# Patient Record
Sex: Female | Born: 1945 | Race: Black or African American | Hispanic: No | Smoking: Never smoker
Health system: Southern US, Community
[De-identification: ages and names within clinical notes are randomized; demographics above are authoritative.]

## PROBLEM LIST (undated history)

## (undated) ENCOUNTER — Emergency Department (HOSPITAL_COMMUNITY): Admission: EM | Payer: BC Managed Care – PPO | Source: Home / Self Care

## (undated) DIAGNOSIS — I10 Essential (primary) hypertension: Secondary | ICD-10-CM

## (undated) DIAGNOSIS — N951 Menopausal and female climacteric states: Secondary | ICD-10-CM

## (undated) DIAGNOSIS — F32A Depression, unspecified: Secondary | ICD-10-CM

## (undated) DIAGNOSIS — F329 Major depressive disorder, single episode, unspecified: Secondary | ICD-10-CM

## (undated) HISTORY — DX: Essential (primary) hypertension: I10

## (undated) HISTORY — DX: Menopausal and female climacteric states: N95.1

## (undated) HISTORY — DX: Major depressive disorder, single episode, unspecified: F32.9

## (undated) HISTORY — DX: Depression, unspecified: F32.A

---

## 1968-06-08 HISTORY — PX: TUBAL LIGATION: SHX77

## 1998-06-08 HISTORY — PX: INGUINAL HERNIA REPAIR: SHX194

## 1998-10-03 ENCOUNTER — Other Ambulatory Visit: Admission: RE | Admit: 1998-10-03 | Discharge: 1998-10-03 | Payer: Self-pay | Admitting: Family Medicine

## 2004-05-05 ENCOUNTER — Ambulatory Visit: Payer: Self-pay | Admitting: Family Medicine

## 2004-06-05 ENCOUNTER — Ambulatory Visit: Payer: Self-pay | Admitting: Family Medicine

## 2004-08-08 ENCOUNTER — Other Ambulatory Visit: Admission: RE | Admit: 2004-08-08 | Discharge: 2004-08-08 | Payer: Self-pay | Admitting: Family Medicine

## 2004-08-08 ENCOUNTER — Ambulatory Visit: Payer: Self-pay | Admitting: Family Medicine

## 2004-10-23 ENCOUNTER — Ambulatory Visit: Payer: Self-pay

## 2005-08-04 ENCOUNTER — Ambulatory Visit: Payer: Self-pay | Admitting: Family Medicine

## 2005-11-06 ENCOUNTER — Ambulatory Visit: Payer: Self-pay | Admitting: Family Medicine

## 2005-11-20 ENCOUNTER — Ambulatory Visit: Payer: Self-pay | Admitting: Family Medicine

## 2005-12-04 ENCOUNTER — Ambulatory Visit: Payer: Self-pay | Admitting: Family Medicine

## 2006-03-05 ENCOUNTER — Ambulatory Visit: Payer: Self-pay | Admitting: Family Medicine

## 2006-04-02 ENCOUNTER — Ambulatory Visit: Payer: Self-pay | Admitting: Family Medicine

## 2006-07-12 DIAGNOSIS — F320 Major depressive disorder, single episode, mild: Secondary | ICD-10-CM | POA: Insufficient documentation

## 2006-07-12 DIAGNOSIS — I10 Essential (primary) hypertension: Secondary | ICD-10-CM | POA: Insufficient documentation

## 2006-11-05 ENCOUNTER — Ambulatory Visit: Payer: Self-pay | Admitting: Family Medicine

## 2006-11-12 ENCOUNTER — Ambulatory Visit: Payer: Self-pay | Admitting: Family Medicine

## 2006-11-17 LAB — CONVERTED CEMR LAB
Alkaline Phosphatase: 73 units/L (ref 39–117)
BUN: 6 mg/dL (ref 6–23)
Basophils Relative: 0.4 % (ref 0.0–1.0)
Bilirubin, Direct: 0.1 mg/dL (ref 0.0–0.3)
CO2: 32 meq/L (ref 19–32)
Cholesterol: 193 mg/dL (ref 0–200)
Eosinophils Relative: 2.4 % (ref 0.0–5.0)
GFR calc Af Amer: 110 mL/min
LDL Cholesterol: 137 mg/dL — ABNORMAL HIGH (ref 0–99)
MCHC: 33.2 g/dL (ref 30.0–36.0)
MCV: 82.3 fL (ref 78.0–100.0)
Monocytes Absolute: 0.6 10*3/uL (ref 0.2–0.7)
Neutro Abs: 4.3 10*3/uL (ref 1.4–7.7)
Neutrophils Relative %: 62 % (ref 43.0–77.0)
Potassium: 4.1 meq/L (ref 3.5–5.1)
RDW: 13.4 % (ref 11.5–14.6)
Total CHOL/HDL Ratio: 4.8
Total Protein: 7.5 g/dL (ref 6.0–8.3)
Triglycerides: 76 mg/dL (ref 0–149)
WBC: 7 10*3/uL (ref 4.5–10.5)

## 2006-12-02 ENCOUNTER — Telehealth (INDEPENDENT_AMBULATORY_CARE_PROVIDER_SITE_OTHER): Payer: Self-pay | Admitting: *Deleted

## 2006-12-24 ENCOUNTER — Ambulatory Visit: Payer: Self-pay | Admitting: Family Medicine

## 2006-12-24 DIAGNOSIS — K430 Incisional hernia with obstruction, without gangrene: Secondary | ICD-10-CM | POA: Insufficient documentation

## 2007-02-11 ENCOUNTER — Encounter: Payer: Self-pay | Admitting: Family Medicine

## 2007-02-11 ENCOUNTER — Other Ambulatory Visit: Admission: RE | Admit: 2007-02-11 | Discharge: 2007-02-11 | Payer: Self-pay | Admitting: Family Medicine

## 2007-02-11 ENCOUNTER — Ambulatory Visit: Payer: Self-pay | Admitting: Family Medicine

## 2007-02-11 DIAGNOSIS — Z87440 Personal history of urinary (tract) infections: Secondary | ICD-10-CM | POA: Insufficient documentation

## 2007-02-11 DIAGNOSIS — N951 Menopausal and female climacteric states: Secondary | ICD-10-CM | POA: Insufficient documentation

## 2007-02-11 LAB — CONVERTED CEMR LAB
Bilirubin Urine: NEGATIVE
Glucose, Urine, Semiquant: NEGATIVE
Pap Smear: NORMAL
Specific Gravity, Urine: 1.025
pH: 6

## 2007-02-14 ENCOUNTER — Encounter: Payer: Self-pay | Admitting: Family Medicine

## 2007-02-15 ENCOUNTER — Encounter (INDEPENDENT_AMBULATORY_CARE_PROVIDER_SITE_OTHER): Payer: Self-pay | Admitting: *Deleted

## 2007-02-17 ENCOUNTER — Encounter (INDEPENDENT_AMBULATORY_CARE_PROVIDER_SITE_OTHER): Payer: Self-pay | Admitting: *Deleted

## 2007-03-21 ENCOUNTER — Ambulatory Visit: Payer: Self-pay | Admitting: Family Medicine

## 2007-03-25 ENCOUNTER — Encounter (INDEPENDENT_AMBULATORY_CARE_PROVIDER_SITE_OTHER): Payer: Self-pay | Admitting: *Deleted

## 2007-03-25 LAB — CONVERTED CEMR LAB
ALT: 15 units/L (ref 0–35)
AST: 14 units/L (ref 0–37)
Alkaline Phosphatase: 74 units/L (ref 39–117)
Basophils Relative: 0.8 % (ref 0.0–1.0)
CO2: 33 meq/L — ABNORMAL HIGH (ref 19–32)
Creatinine, Ser: 0.7 mg/dL (ref 0.4–1.2)
Eosinophils Relative: 2.6 % (ref 0.0–5.0)
GFR calc Af Amer: 110 mL/min
Glucose, Bld: 105 mg/dL — ABNORMAL HIGH (ref 70–99)
HCT: 37.9 % (ref 36.0–46.0)
HDL: 34.9 mg/dL — ABNORMAL LOW (ref >39.0)
Hemoglobin: 12.6 g/dL (ref 12.0–15.0)
LDL Cholesterol: 112 mg/dL — ABNORMAL HIGH (ref 0–99)
Lymphocytes Relative: 26.9 % (ref 12.0–46.0)
Monocytes Absolute: 0.6 10*3/uL (ref 0.2–0.7)
Neutro Abs: 4.1 10*3/uL (ref 1.4–7.7)
Neutrophils Relative %: 60.8 % (ref 43.0–77.0)
Potassium: 3.7 meq/L (ref 3.5–5.1)
RDW: 13.5 % (ref 11.5–14.6)
Sodium: 146 meq/L — ABNORMAL HIGH (ref 135–145)
Total Bilirubin: 0.6 mg/dL (ref 0.3–1.2)
Total Protein: 6.6 g/dL (ref 6.0–8.3)
VLDL: 21 mg/dL (ref 0–40)

## 2007-04-14 ENCOUNTER — Telehealth (INDEPENDENT_AMBULATORY_CARE_PROVIDER_SITE_OTHER): Payer: Self-pay | Admitting: *Deleted

## 2007-04-25 ENCOUNTER — Ambulatory Visit: Payer: Self-pay | Admitting: Family Medicine

## 2007-04-25 DIAGNOSIS — M79609 Pain in unspecified limb: Secondary | ICD-10-CM | POA: Insufficient documentation

## 2007-07-12 ENCOUNTER — Encounter: Payer: Self-pay | Admitting: Family Medicine

## 2007-08-04 ENCOUNTER — Ambulatory Visit: Payer: Self-pay | Admitting: Family Medicine

## 2007-08-04 DIAGNOSIS — M25569 Pain in unspecified knee: Secondary | ICD-10-CM | POA: Insufficient documentation

## 2007-10-19 ENCOUNTER — Telehealth (INDEPENDENT_AMBULATORY_CARE_PROVIDER_SITE_OTHER): Payer: Self-pay | Admitting: *Deleted

## 2008-03-23 ENCOUNTER — Ambulatory Visit: Payer: Self-pay | Admitting: Family Medicine

## 2008-03-23 LAB — CONVERTED CEMR LAB
Bilirubin Urine: NEGATIVE
Glucose, Urine, Semiquant: NEGATIVE
Protein, U semiquant: NEGATIVE
Specific Gravity, Urine: 1.025
WBC Urine, dipstick: NEGATIVE
pH: 5

## 2008-03-30 ENCOUNTER — Telehealth (INDEPENDENT_AMBULATORY_CARE_PROVIDER_SITE_OTHER): Payer: Self-pay | Admitting: *Deleted

## 2008-03-30 ENCOUNTER — Ambulatory Visit: Payer: Self-pay | Admitting: Family Medicine

## 2008-03-30 ENCOUNTER — Encounter (INDEPENDENT_AMBULATORY_CARE_PROVIDER_SITE_OTHER): Payer: Self-pay | Admitting: *Deleted

## 2008-03-30 LAB — CONVERTED CEMR LAB
Bilirubin Urine: NEGATIVE
Ketones, urine, test strip: NEGATIVE
Nitrite: NEGATIVE
pH: 6

## 2008-03-31 ENCOUNTER — Encounter: Payer: Self-pay | Admitting: Family Medicine

## 2008-04-02 ENCOUNTER — Encounter (INDEPENDENT_AMBULATORY_CARE_PROVIDER_SITE_OTHER): Payer: Self-pay | Admitting: *Deleted

## 2008-04-02 LAB — CONVERTED CEMR LAB: GC Probe Amp, Urine: NEGATIVE

## 2008-04-04 ENCOUNTER — Encounter (INDEPENDENT_AMBULATORY_CARE_PROVIDER_SITE_OTHER): Payer: Self-pay | Admitting: *Deleted

## 2008-06-26 ENCOUNTER — Encounter: Payer: Self-pay | Admitting: Family Medicine

## 2008-10-19 ENCOUNTER — Telehealth (INDEPENDENT_AMBULATORY_CARE_PROVIDER_SITE_OTHER): Payer: Self-pay | Admitting: *Deleted

## 2008-11-09 ENCOUNTER — Ambulatory Visit: Payer: Self-pay | Admitting: Family Medicine

## 2008-11-09 DIAGNOSIS — R609 Edema, unspecified: Secondary | ICD-10-CM | POA: Insufficient documentation

## 2008-11-18 LAB — CONVERTED CEMR LAB
AST: 16 units/L (ref 0–37)
Albumin: 3.8 g/dL (ref 3.5–5.2)
BUN: 18 mg/dL (ref 6–23)
Basophils Absolute: 0.1 10*3/uL (ref 0.0–0.1)
CO2: 33 meq/L — ABNORMAL HIGH (ref 19–32)
Cholesterol: 192 mg/dL (ref 0–200)
Eosinophils Absolute: 0.1 10*3/uL (ref 0.0–0.7)
GFR calc non Af Amer: 93.72 mL/min (ref >60)
Glucose, Bld: 115 mg/dL — ABNORMAL HIGH (ref 70–99)
HCT: 40.2 % (ref 36.0–46.0)
Hemoglobin: 13.5 g/dL (ref 12.0–15.0)
Lymphs Abs: 1.6 10*3/uL (ref 0.7–4.0)
MCHC: 33.5 g/dL (ref 30.0–36.0)
MCV: 83.2 fL (ref 78.0–100.0)
Monocytes Absolute: 0.4 10*3/uL (ref 0.1–1.0)
Neutro Abs: 3.6 10*3/uL (ref 1.4–7.7)
Platelets: 200 10*3/uL (ref 150.0–400.0)
Potassium: 4.2 meq/L (ref 3.5–5.1)
RDW: 13.7 % (ref 11.5–14.6)
Sodium: 144 meq/L (ref 135–145)
TSH: 1.31 microintl units/mL (ref 0.35–5.50)
Total Bilirubin: 0.6 mg/dL (ref 0.3–1.2)
VLDL: 20.8 mg/dL (ref 0.0–40.0)

## 2008-11-19 ENCOUNTER — Encounter (INDEPENDENT_AMBULATORY_CARE_PROVIDER_SITE_OTHER): Payer: Self-pay | Admitting: *Deleted

## 2008-11-23 ENCOUNTER — Ambulatory Visit: Payer: Self-pay | Admitting: Family Medicine

## 2008-12-21 ENCOUNTER — Ambulatory Visit: Payer: Self-pay | Admitting: Family Medicine

## 2008-12-25 ENCOUNTER — Telehealth (INDEPENDENT_AMBULATORY_CARE_PROVIDER_SITE_OTHER): Payer: Self-pay | Admitting: *Deleted

## 2008-12-26 ENCOUNTER — Telehealth (INDEPENDENT_AMBULATORY_CARE_PROVIDER_SITE_OTHER): Payer: Self-pay | Admitting: *Deleted

## 2009-02-07 ENCOUNTER — Ambulatory Visit: Payer: Self-pay | Admitting: Family Medicine

## 2009-02-07 ENCOUNTER — Observation Stay (HOSPITAL_COMMUNITY): Admission: EM | Admit: 2009-02-07 | Discharge: 2009-02-08 | Payer: Self-pay | Admitting: Emergency Medicine

## 2009-02-07 DIAGNOSIS — R079 Chest pain, unspecified: Secondary | ICD-10-CM | POA: Insufficient documentation

## 2009-02-08 ENCOUNTER — Encounter (INDEPENDENT_AMBULATORY_CARE_PROVIDER_SITE_OTHER): Payer: Self-pay | Admitting: Internal Medicine

## 2009-02-08 ENCOUNTER — Ambulatory Visit: Payer: Self-pay | Admitting: Vascular Surgery

## 2009-02-08 DIAGNOSIS — R9431 Abnormal electrocardiogram [ECG] [EKG]: Secondary | ICD-10-CM | POA: Insufficient documentation

## 2009-02-12 ENCOUNTER — Telehealth (INDEPENDENT_AMBULATORY_CARE_PROVIDER_SITE_OTHER): Payer: Self-pay | Admitting: *Deleted

## 2009-02-13 ENCOUNTER — Ambulatory Visit: Payer: Self-pay

## 2009-02-13 ENCOUNTER — Encounter: Payer: Self-pay | Admitting: Cardiology

## 2009-02-14 ENCOUNTER — Ambulatory Visit: Payer: Self-pay

## 2009-02-15 ENCOUNTER — Ambulatory Visit: Payer: Self-pay | Admitting: Family Medicine

## 2009-02-15 DIAGNOSIS — R0989 Other specified symptoms and signs involving the circulatory and respiratory systems: Secondary | ICD-10-CM

## 2009-02-15 DIAGNOSIS — R0609 Other forms of dyspnea: Secondary | ICD-10-CM | POA: Insufficient documentation

## 2009-02-15 DIAGNOSIS — G4700 Insomnia, unspecified: Secondary | ICD-10-CM | POA: Insufficient documentation

## 2009-02-19 ENCOUNTER — Telehealth (INDEPENDENT_AMBULATORY_CARE_PROVIDER_SITE_OTHER): Payer: Self-pay | Admitting: *Deleted

## 2009-02-19 LAB — CONVERTED CEMR LAB
Anti Nuclear Antibody(ANA): NEGATIVE
BUN: 15 mg/dL (ref 6–23)
Chloride: 103 meq/L (ref 96–112)
Magnesium: 1.8 mg/dL (ref 1.5–2.5)
Potassium: 4.2 meq/L (ref 3.5–5.3)
Rhuematoid fact SerPl-aCnc: 21 intl units/mL — ABNORMAL HIGH (ref 0–20)
Sed Rate: 8 mm/hr (ref 0–22)

## 2009-02-25 ENCOUNTER — Ambulatory Visit: Payer: Self-pay | Admitting: Pulmonary Disease

## 2009-02-25 ENCOUNTER — Telehealth: Payer: Self-pay | Admitting: Family Medicine

## 2009-02-26 ENCOUNTER — Ambulatory Visit: Payer: Self-pay | Admitting: Family Medicine

## 2009-03-05 ENCOUNTER — Ambulatory Visit: Payer: Self-pay | Admitting: Family Medicine

## 2009-03-13 ENCOUNTER — Telehealth (INDEPENDENT_AMBULATORY_CARE_PROVIDER_SITE_OTHER): Payer: Self-pay | Admitting: *Deleted

## 2009-03-24 ENCOUNTER — Ambulatory Visit (HOSPITAL_BASED_OUTPATIENT_CLINIC_OR_DEPARTMENT_OTHER): Admission: RE | Admit: 2009-03-24 | Discharge: 2009-03-24 | Payer: Self-pay | Admitting: Pulmonary Disease

## 2009-03-24 ENCOUNTER — Encounter: Payer: Self-pay | Admitting: Pulmonary Disease

## 2009-04-02 ENCOUNTER — Ambulatory Visit: Payer: Self-pay | Admitting: Pulmonary Disease

## 2009-04-03 ENCOUNTER — Telehealth (INDEPENDENT_AMBULATORY_CARE_PROVIDER_SITE_OTHER): Payer: Self-pay | Admitting: *Deleted

## 2009-04-15 ENCOUNTER — Ambulatory Visit: Payer: Self-pay | Admitting: Pulmonary Disease

## 2009-04-15 DIAGNOSIS — G4733 Obstructive sleep apnea (adult) (pediatric): Secondary | ICD-10-CM | POA: Insufficient documentation

## 2009-07-04 ENCOUNTER — Ambulatory Visit: Payer: Self-pay | Admitting: Family Medicine

## 2009-07-04 ENCOUNTER — Telehealth (INDEPENDENT_AMBULATORY_CARE_PROVIDER_SITE_OTHER): Payer: Self-pay | Admitting: *Deleted

## 2010-01-31 ENCOUNTER — Ambulatory Visit: Payer: Self-pay | Admitting: Family Medicine

## 2010-02-04 ENCOUNTER — Encounter: Payer: Self-pay | Admitting: Gastroenterology

## 2010-02-04 ENCOUNTER — Encounter (INDEPENDENT_AMBULATORY_CARE_PROVIDER_SITE_OTHER): Payer: Self-pay | Admitting: *Deleted

## 2010-02-13 ENCOUNTER — Ambulatory Visit (HOSPITAL_COMMUNITY): Admission: RE | Admit: 2010-02-13 | Discharge: 2010-02-13 | Payer: Self-pay | Admitting: Family Medicine

## 2010-02-13 ENCOUNTER — Ambulatory Visit: Payer: Self-pay

## 2010-02-13 ENCOUNTER — Ambulatory Visit: Payer: Self-pay | Admitting: Cardiology

## 2010-02-13 ENCOUNTER — Encounter: Payer: Self-pay | Admitting: Family Medicine

## 2010-02-14 ENCOUNTER — Ambulatory Visit: Payer: Self-pay | Admitting: Family Medicine

## 2010-02-15 DIAGNOSIS — I519 Heart disease, unspecified: Secondary | ICD-10-CM | POA: Insufficient documentation

## 2010-02-27 ENCOUNTER — Telehealth: Payer: Self-pay | Admitting: Family Medicine

## 2010-03-10 ENCOUNTER — Encounter (INDEPENDENT_AMBULATORY_CARE_PROVIDER_SITE_OTHER): Payer: Self-pay | Admitting: *Deleted

## 2010-03-12 ENCOUNTER — Ambulatory Visit: Payer: Self-pay | Admitting: Gastroenterology

## 2010-05-16 ENCOUNTER — Ambulatory Visit: Payer: Self-pay | Admitting: Family Medicine

## 2010-06-29 ENCOUNTER — Encounter: Payer: Self-pay | Admitting: Family Medicine

## 2010-07-08 NOTE — Letter (Signed)
Summary: [facility] Instructions  [facility]  [address] [place], [place] [address]   Phone: [phone]  Fax: [phone]       CALLI BASHOR    September 12, 1959    MRN: [id number]        Procedure Day Dorna Bloom:  Wednesday 03/26/2010     Arrival Time: 7:30 am      Procedure Time: 8:30 am     Location of Procedure:                    _ x_  [facility] (4th Floor)                        PREPARATION FOR COLONOSCOPY WITH MOVIPREP   Starting 5 days prior to your procedure Friday 10/14 do not eat nuts, seeds, popcorn, corn, beans, peas,  salads, or any raw vegetables.  Do not take any fiber supplements (e.g. Metamucil, Citrucel, and Benefiber).  THE DAY BEFORE YOUR PROCEDURE         DATE: Tuesday 10/18  1.  Drink clear liquids the entire day-NO SOLID FOOD  2.  Do not drink anything colored red or purple.  Avoid juices with pulp.  No orange juice.  3.  Drink at least 64 oz. (8 glasses) of fluid/clear liquids during the day to prevent dehydration and help the prep work efficiently.  CLEAR LIQUIDS INCLUDE: Water Jello Ice Popsicles Tea (sugar ok, no milk/cream) Powdered fruit flavored drinks Coffee (sugar ok, no milk/cream) Gatorade Juice: apple, white grape, white cranberry  Lemonade Clear bullion, consomm, broth Carbonated beverages (any kind) Strained chicken noodle soup Hard Candy                             4.  In the morning, mix first dose of MoviPrep solution:    Empty 1 Pouch A and 1 Pouch B into the disposable container    Add lukewarm drinking water to the top line of the container. Mix to dissolve    Refrigerate (mixed solution should be used within 24 hrs)  5.  Begin drinking the prep at 5:00 p.m. The MoviPrep container is divided by 4 marks.   Every 15 minutes drink the solution down to the next mark (approximately 8 oz) until the full liter is complete.   6.  Follow completed prep with 16 oz of clear liquid of your choice  (Nothing red or purple).  Continue to drink clear liquids until bedtime.  7.  Before going to bed, mix second dose of MoviPrep solution:    Empty 1 Pouch A and 1 Pouch B into the disposable container    Add lukewarm drinking water to the top line of the container. Mix to dissolve    Refrigerate  THE DAY OF YOUR PROCEDURE      DATE: Wednesday 10/19  Beginning at 3:30 a.m. (5 hours before procedure):         1. Every 15 minutes, drink the solution down to the next mark (approx 8 oz) until the full liter is complete.  2. Follow completed prep with 16 oz. of clear liquid of your choice.    3. You may drink clear liquids until 6:30 am (2 HOURS BEFORE PROCEDURE).   MEDICATION INSTRUCTIONS  Unless otherwise instructed, you should take regular prescription medications with a small sip of water   as early as possible the morning of  your procedure.   Additional medication instructions:  Hold Furosemide morning of procedure only!         OTHER INSTRUCTIONS  You will need a responsible adult at least 63 years of age to accompany you and drive you home.   This person must remain in the waiting room during your procedure.  Wear loose fitting clothing that is easily removed.  Leave jewelry and other valuables at home.  However, you may wish to bring a book to read or  an iPod/MP3 player to listen to music as you wait for your procedure to start.  Remove all body piercing jewelry and leave at home.  Total time from sign-in until discharge is approximately 2-3 hours.  You should go home directly after your procedure and rest.  You can resume normal activities the  day after your procedure.  The day of your procedure you should not:   Drive   Make legal decisions   Operate machinery   Drink alcohol   Return to work  You will receive specific instructions about eating, activities and medications before you leave.    The above instructions have been reviewed and  explained to me by   Sherren Kerns RN  March 12, 2010 9:45 AM    I fully understand and can verbalize these instructions _____________________________ Date _________

## 2010-07-08 NOTE — Progress Notes (Signed)
Summary: X- ray results  Phone Note Outgoing Call   Call placed by: Army Fossa CMA,  July 04, 2009 2:59 PM Summary of Call: Regarding X-rays,LMTCB:  Tibia/Fibula: no abnormality where pain was Signed by Loreen Freud DO on 07/04/2009 at 2:51 PM  Left Knee: + arthritis Signed by Loreen Freud DO on 07/04/2009 at 2:51 PM      Follow-up for Phone Call        LMTCB. Army Fossa CMA  July 10, 2009 1:20 PM   Additional Follow-up for Phone Call Additional follow up Details #1::        LMTCB.Army Fossa CMA  July 17, 2009 1:49 PM     Additional Follow-up for Phone Call Additional follow up Details #2::    LMTCB. Army Fossa CMA  July 18, 2009 10:14 AM   Additional Follow-up for Phone Call Additional follow up Details #3:: Details for Additional Follow-up Action Taken: Pt is aware. Army Fossa CMA  July 18, 2009 4:02 PM

## 2010-07-08 NOTE — Assessment & Plan Note (Signed)
Summary: PAIN IN LEGS/KDC   Vital Signs:  Patient profile:   65 year old female Weight:      269 pounds Temp:     98.1 degrees F oral Pulse rate:   86 / minute Pulse rhythm:   regular BP sitting:   130 / 84  (left arm) Cuff size:   large  Vitals Entered By: Army Fossa CMA (July 04, 2009 9:50 AM) CC: Pain in both legs discuss meds- she is only taking bupropion,metorplol,ultram.    History of Present Illness: Pt here still c/o pain in left leg.  No new injury.  Pt is in Knee and shin-- after she gets up leg feels stiff and it is better when she has been up for   Current Medications (verified): 1)  Bupropion Hcl (Smoking Deter) 150 Mg Tb12 (Bupropion Hcl (Smoking Deter)) .... 3 Once Daily 2)  Metoprolol Tartrate 100 Mg Tabs (Metoprolol Tartrate) .... Take 1 Tablet By Mouth Two Times A Day 3)  Aspirin 325 Mg Tabs (Aspirin) .Marland Kitchen.. 1 By Mouth Once Daily 4)  Furosemide 20 Mg Tabs (Furosemide) .... 3 Tablets By Mouth Daily 5)  Lisinopril 20 Mg Tabs (Lisinopril) .Marland Kitchen.. 1 By Mouth Once Daily 6)  Ultram 50 Mg Tabs (Tramadol Hcl) .Marland Kitchen.. 1-2 By Mouth Every 6 Hours As Needed 7)  Vitamin D (Ergocalciferol) 50000 Unit Caps (Ergocalciferol) .Marland Kitchen.. 1 By Mouth Weekly. 8)  Vitamin D3 2000 Unit Caps (Cholecalciferol) .Marland Kitchen.. 1 By Mouth Once Daily.  Allergies: 1)  * Norvasc  Past History:  Past Medical History: Last updated: 02/25/2009  SNORING (ICD-786.09) INSOMNIA (ICD-780.52) ABNORMAL ELECTROCARDIOGRAM (ICD-794.31) ARM PAIN, LEFT (ICD-729.5) CHEST PAIN UNSPECIFIED (ICD-786.50) EDEMA (ICD-782.3) KNEE PAIN (ICD-719.46) LEG PAIN, LEFT (ICD-729.5) FOOT PAIN, RIGHT (ICD-729.5) HX, URINARY INFECTION (ICD-V13.02) MENOPAUSAL SYNDROME (ICD-627.2) PREVENTIVE HEALTH CARE (ICD-V70.0) HYPERTENSION (ICD-401.9) DEPRESSION (ICD-311)    Past Surgical History: Last updated: 02/25/2009 Tubal ligation 1970 Inguinal herniorrhaphy (2000)  Family History: Last updated: 02/25/2009 Family History  Diabetes 1st degree relative Family History Hypertension Family History Liver disease M--Panc. CA Family History of Alcoholism/Addiction Family History Depression  asthma: son cancer: mother (pancreatic) maternal grandmother (breast)   Social History: Last updated: 02/11/2007 Occupation: [employer] at [employer] Married, 3 children Never Smoked Alcohol use-yes Drug use-no Regular exercise-yes  Risk Factors: Alcohol Use: <1 (02/07/2009) Caffeine Use: 0 (02/07/2009) Exercise: yes (02/07/2009)  Risk Factors: Smoking Status: never (02/07/2009) Passive Smoke Exposure: no (02/07/2009)  Family History: Reviewed history from 02/25/2009 and no changes required. Family History Diabetes 1st degree relative Family History Hypertension Family History Liver disease M--Panc. CA Family History of Alcoholism/Addiction Family History Depression  asthma: son cancer: mother (pancreatic) maternal grandmother (breast)   Social History: Reviewed history from 02/11/2007 and no changes required. Occupation: [employer] at [employer], 3 children Never Smoked Alcohol use-yes Drug use-no Regular exercise-yes  Review of Systems      See HPI  Physical Exam  General:  Well-developed,well-nourished,in no acute distress; alert,appropriate and cooperative throughout examination Msk:  normal ROM, no joint tenderness, no joint swelling, no joint warmth, no redness over joints, no joint deformities, no joint instability, and no crepitation.   Extremities:  No clubbing, cyanosis, edema, or deformity noted with normal full range of motion of all joints.   Neurologic:  alert & oriented X3, strength normal in all extremities, gait normal, and DTRs symmetrical and normal.   Psych:  Cognition and judgment appear intact. Alert and cooperative with normal attention span and concentration. No apparent delusions, illusions, hallucinations  Impression & Recommendations:  Problem # 1:  LEG PAIN, LEFT  (ICD-729.5) ultram as needed  Orders: Physical Therapy Referral (PT) T-Knee Left 2 view (73560TC)  Complete Medication List: 1)  Bupropion Hcl (smoking Deter) 150 Mg Tb12 (Bupropion hcl (smoking deter)) .... 3 once daily 2)  Metoprolol Tartrate 100 Mg Tabs (Metoprolol tartrate) .... Take 1 tablet by mouth two times a day 3)  Aspirin 325 Mg Tabs (Aspirin) .Marland Kitchen.. 1 by mouth once daily 4)  Furosemide 20 Mg Tabs (Furosemide) .... 3 tablets by mouth daily 5)  Lisinopril 20 Mg Tabs (Lisinopril) .Marland Kitchen.. 1 by mouth once daily 6)  Ultram 50 Mg Tabs (Tramadol hcl) .Marland Kitchen.. 1-2 by mouth every 6 hours as needed 7)  Vitamin D (ergocalciferol) 50000 Unit Caps (Ergocalciferol) .Marland Kitchen.. 1 by mouth weekly. 8)  Vitamin D3 2000 Unit Caps (Cholecalciferol) .Marland Kitchen.. 1 by mouth once daily.  Other Orders: T-Tib/Fib Left (73590TC) Prescriptions: FUROSEMIDE 20 MG TABS (FUROSEMIDE) 3 tablets by mouth daily  #30 x 5   Entered and Authorized by:   Loreen Freud DO   Signed by:   Loreen Freud DO on 07/04/2009   Method used:   [facility]        [facility] [phone]* (retail)       [address] [place]  [address]       Ph: [phone] or [phone]       Fax: [phone]   RxID:   [id number] LISINOPRIL 20 MG TABS (LISINOPRIL) 1 by mouth once daily  #30 x 2   Entered and Authorized by:   Loreen Freud DO   Signed by:   Loreen Freud DO on 07/04/2009   Method used:   [facility]        [facility] [phone]* (retail)       [address]       [place], [place]  [address]       Ph: [phone] or [phone]       Fax: [phone]   RxID:   [id number] VITAMIN D (ERGOCALCIFEROL) 50000 UNIT CAPS (ERGOCALCIFEROL) 1 by mouth weekly.  #12 x 2   Entered and Authorized by:   Loreen Freud DO   Signed by:   Loreen Freud DO on 07/04/2009   Method used:   [facility]        [facility] [phone]* (retail)       [address] [place]  [address]       Ph: [phone] or  [phone]       Fax: [phone]   RxID:   [id number] ULTRAM 50 MG TABS (TRAMADOL HCL) 1-2 by mouth every 6 hours as needed  #60 x 1   Entered and Authorized by:   Loreen Freud DO   Signed by:   Loreen Freud DO on 07/04/2009   Method used:   [facility]        [facility] [phone]* (retail)       [address], [place]  [address]       Ph: [phone] or [phone]       Fax: [phone]   RxID:   [id number] METOPROLOL TARTRATE 100 MG TABS (METOPROLOL TARTRATE) Take 1 tablet by mouth two times a day  #180 Tablet x 4   Entered and Authorized by:  Loreen Freud DO   Signed by:   Loreen Freud DO on 07/04/2009   Method used:   [facility]        [facility] [phone]* (retail)       [address] [place]  [address]       Ph: [phone] or [phone]       Fax: [phone]   RxID:   [id number] BUPROPION HCL (SMOKING DETER) 150 MG TB12 (BUPROPION HCL (SMOKING DETER)) 3 once daily  #180 Tablet x 5   Entered and Authorized by:   Loreen Freud DO   Signed by:   Loreen Freud DO on 07/04/2009   Method used:   [facility]        [facility] [phone]* (retail)       [address]       [place], [place]  [address]       Ph: [phone] or [phone]       Fax: [phone]   RxID:   [id number]

## 2010-07-08 NOTE — Progress Notes (Signed)
Summary: less expensive med  Phone Note Refill Request Message from:  Fax from Pharmacy on February 27, 2010 4:52 PM  Refills Requested: Medication #1:  BENICAR 20 MG TABS 1 by mouth once daily [facility] [address] [facility] [id number] - note "could you please change to a cost saving generic"  Initial call taken by: Okey Regal [place],  February 27, 2010 4:54 PM  Follow-up for Phone Call        cozaar 50 mg 1 by mouth once daily  #30  2 refills recheck bp 2-3 weeks Follow-up by: Loreen Freud DO,  February 28, 2010 9:46 AM  Additional Follow-up for Phone Call Additional follow up Details #1::        pt aware, will call back to schedule f/u appt. [facility] [place]. Additional Follow-up by: Almeta Monas CMA [place]),  February 28, 2010 3:23 PM    New/Updated Medications: COZAAR 50 MG TABS (LOSARTAN POTASSIUM) 1 by mouth once daily Prescriptions: COZAAR 50 MG TABS (LOSARTAN POTASSIUM) 1 by mouth once daily  #30 x 2   Entered by:   Almeta Monas CMA (AAMA)   Authorized by:   Loreen Freud DO   Signed by:   Almeta Monas CMA (AAMA) on 02/28/2010   Method used:   Printed then faxed to ...       [facility] [place] [phone]* (retail)       [phone]       [place], [place]  [address]       Ph: [phone] or [phone]       Fax: [phone]   RxID:   [id number]

## 2010-07-08 NOTE — Letter (Signed)
Summary: Primary Care Consult Scheduled Letter  [facility] [place]  [address] [place], [place] [address]   Phone: [phone]  Fax: [phone]      02/04/2010 MRN: [id number]  [facility] [address] [place], [place]  [address]    Dear Ms. Begue,    We have scheduled an appointment for you.  At the recommendation of Dr. Loreen Freud, we have scheduled you for a Screening Colonoscopy with [facility].  Your initial Pre-visit with a Nurse is on 03-12-2010 at 9:30am.  Your Colonoscopy is on 03-26-2010 arrive by 7:30am with Dr. Melvia Heaps.  Their address is [address], [place] [address]. The office phone number is [phone].  If this appointment day and time is not convenient for you, please feel free to call the office of the doctor you are being referred to at the number listed above and reschedule the appointment.    It is important for you to keep your scheduled appointments. We are here to make sure you are given good patient care.   Thank you,    Renee, Patient Care Coordinator [place] at [facility]

## 2010-07-08 NOTE — Miscellaneous (Signed)
Summary: previsit prep/rm  Clinical Lists Changes  Medications: Added new medication of MOVIPREP 100 GM  SOLR (PEG-KCL-NACL-NASULF-NA ASC-C) As per prep instructions. - Signed Rx of MOVIPREP 100 GM  SOLR (PEG-KCL-NACL-NASULF-NA ASC-C) As per prep instructions.;  #1 x 0;  Signed;  Entered by: Sherren Kerns RN;  Authorized by: Louis Meckel MD;  Method used: [facility]. [phone]*, [address] [place], [place]  [address], Ph: [phone] or [phone], Fax: [phone] Observations: Added new observation of ALLERGY REV: Done (03/12/2010 9:07)    Prescriptions: MOVIPREP 100 GM  SOLR (PEG-KCL-NACL-NASULF-NA ASC-C) As per prep instructions.  #1 x 0   Entered by:   Sherren Kerns RN   Authorized by:   Louis Meckel MD   Signed by:   Sherren Kerns RN on 03/12/2010   Method used:   [facility]        [facility]. [phone]* (retail)       [address]       [place], [place]  [address]       Ph: [phone] or [phone]       Fax: [phone]   RxID:   [id number]

## 2010-07-08 NOTE — Letter (Signed)
Summary: Primary Care Consult Scheduled Letter  [facility] [place]  [address] [place], [place] [address]   Phone: [phone]  Fax: [phone]      02/04/2010 MRN: [id number]  [facility] [address] [place], [place]  [address]    Dear Michaela Martinez,    We have scheduled an appointment for you.  At the recommendation of Dr. Loreen Freud, we have scheduled you for an Echocardiogram with [facility] on 02-13-2010 arrive by 5:00pm.  Their address is [address] [place], [place] [address]. The office phone number is [phone].  If this appointment day and time is not convenient for you, please feel free to call the office of the doctor you are being referred to at the number listed above and reschedule the appointment.    It is important for you to keep your scheduled appointments. We are here to make sure you are given good patient care.  Thank you,    Michaela Martinez, Patient Care Coordinator [place] at [facility]

## 2010-07-08 NOTE — Letter (Signed)
Summary: Pre Visit Letter Revised  [facility]  [address] [place], [place] [address]   Phone: [phone]  Fax: [phone]        02/04/2010 MRN: [id number] [facility] [address] [place], [place]  [address]             Procedure Date:  03-26-10 Wed  Welcome to the Gastroenterology Division at [facility].    You are scheduled to see a nurse for your pre-procedure visit on 03-12-10 at 9:30am on the 3rd floor at [facility], [address]  We ask that you try to arrive at our office 15 minutes prior to your appointment time to allow for check-in.  Please take a minute to review the attached form.  If you answer "Yes" to one or more of the questions on the first page, we ask that you call the person listed at your earliest opportunity.  If you answer "No" to all of the questions, please complete the rest of the form and bring it to your appointment.    Your nurse visit will consist of discussing your medical and surgical history, your immediate family medical history, and your medications.   If you are unable to list all of your medications on the form, please bring the medication bottles to your appointment and we will list them.  We will need to be aware of both prescribed and over the counter drugs.  We will need to know exact dosage information as well.    Please be prepared to read and sign documents such as consent forms, a financial agreement, and acknowledgement forms.  If necessary, and with your consent, a friend or relative is welcome to sit-in on the nurse visit with you.  Please bring your insurance card so that we may make a copy of it.  If your insurance requires a referral to see a specialist, please bring your referral form from your primary care physician.  No co-pay is required for this nurse visit.     If you cannot keep your appointment, please call [phone] to cancel or reschedule prior to your appointment date.  This  allows [place] the opportunity to schedule an appointment for another patient in need of care.    Thank you for choosing [facility] for your medical needs.  We appreciate the opportunity to care for you.  Please visit [place] at our website  to learn more about our practice.  Sincerely, The Gastroenterology Division

## 2010-07-08 NOTE — Assessment & Plan Note (Signed)
Summary: review meds//lch   Vital Signs:  Patient profile:   65 year old female Weight:      272 pounds BMI:     40.31 Temp:     98.3 degrees F oral Pulse rate:   72 / minute Pulse rhythm:   regular BP sitting:   152 / 96  (left arm)  Vitals Entered By: Almeta Monas CMA Duncan Dull) (January 31, 2010 3:32 PM) CC: wants to discuss meds   History of Present Illness: Pt here c/o swelling both feet and she does not like lisinopril-- "It makes me crazy"  No other complaints.  Current Medications (verified): 1)  Bupropion Hcl (Smoking Deter) 150 Mg Tb12 (Bupropion Hcl (Smoking Deter)) .... 3 Once Daily 2)  Metoprolol Tartrate 100 Mg Tabs (Metoprolol Tartrate) .... Take 1 Tablet By Mouth Two Times A Day 3)  Aspirin 325 Mg Tabs (Aspirin) .Marland Kitchen.. 1 By Mouth Once Daily 4)  Furosemide 20 Mg Tabs (Furosemide) .... 3 Tablets By Mouth Daily 5)  Lisinopril 20 Mg Tabs (Lisinopril) .Marland Kitchen.. 1 By Mouth Once Daily 6)  Ultram 50 Mg Tabs (Tramadol Hcl) .Marland Kitchen.. 1-2 By Mouth Every 6 Hours As Needed 7)  Vitamin D (Ergocalciferol) 50000 Unit Caps (Ergocalciferol) .Marland Kitchen.. 1 By Mouth Weekly. 8)  Vitamin D3 2000 Unit Caps (Cholecalciferol) .Marland Kitchen.. 1 By Mouth Once Daily.  Allergies (verified): 1)  * Norvasc  Past History:  Past medical, surgical, family and social histories (including risk factors) reviewed for relevance to current acute and chronic problems.  Past Medical History: Reviewed history from 02/25/2009 and no changes required.  SNORING (ICD-786.09) INSOMNIA (ICD-780.52) ABNORMAL ELECTROCARDIOGRAM (ICD-794.31) ARM PAIN, LEFT (ICD-729.5) CHEST PAIN UNSPECIFIED (ICD-786.50) EDEMA (ICD-782.3) KNEE PAIN (ICD-719.46) LEG PAIN, LEFT (ICD-729.5) FOOT PAIN, RIGHT (ICD-729.5) HX, URINARY INFECTION (ICD-V13.02) MENOPAUSAL SYNDROME (ICD-627.2) PREVENTIVE HEALTH CARE (ICD-V70.0) HYPERTENSION (ICD-401.9) DEPRESSION (ICD-311)    Past Surgical History: Reviewed history from 02/25/2009 and no changes  required. Tubal ligation 1970 Inguinal herniorrhaphy (2000)  Family History: Reviewed history from 02/25/2009 and no changes required. Family History Diabetes 1st degree relative Family History Hypertension Family History Liver disease M--Panc. CA Family History of Alcoholism/Addiction Family History Depression  asthma: son cancer: mother (pancreatic) maternal grandmother (breast)   Social History: Reviewed history from 02/11/2007 and no changes required. Occupation: [employer] at [employer], 3 children Never Smoked Alcohol use-yes Drug use-no Regular exercise-yes  Review of Systems      See HPI  Physical Exam  General:  Well-developed,well-nourished,in no acute distress; alert,appropriate and cooperative throughout examination Lungs:  Normal respiratory effort, chest expands symmetrically. Lungs are clear to auscultation, no crackles or wheezes. Heart:  normal rate and no murmur.   Extremities:  2+ left pedal edema and 2+ right pedal edema.   Psych:  Cognition and judgment appear intact. Alert and cooperative with normal attention span and concentration. No apparent delusions, illusions, hallucinations   Impression & Recommendations:  Problem # 1:  EDEMA (ICD-782.3)  Her updated medication list for this problem includes:    Furosemide 20 Mg Tabs (Furosemide) .Marland KitchenMarland KitchenMarland KitchenMarland Kitchen 3 tablets by mouth daily  Orders: Echo Referral (Echo)  Discussed elevation of the legs, use of compression stockings, sodium restiction, and medication use.   Problem # 2:  KNEE PAIN (ICD-719.46)  Her updated medication list for this problem includes:    Aspirin 325 Mg Tabs (Aspirin) .Marland Kitchen... 1 by mouth once daily    Ultram 50 Mg Tabs (Tramadol hcl) .Marland Kitchen... 1-2 by mouth every 6 hours as needed  Discussed strengthening  exercises, use of ice or heat, and medications.   Problem # 3:  HYPERTENSION (ICD-401.9)  Her updated medication list for this problem includes:    Metoprolol Tartrate 100 Mg Tabs  (Metoprolol tartrate) .Marland Kitchen... Take 1 tablet by mouth two times a day    Furosemide 20 Mg Tabs (Furosemide) .Marland KitchenMarland KitchenMarland KitchenMarland Kitchen 3 tablets by mouth daily    Benicar 20 Mg Tabs (Olmesartan medoxomil) .Marland Kitchen... 1 by mouth once daily  BP today: 152/96 Prior BP: 130/84 (07/04/2009)  Labs Reviewed: K+: 4.2 (02/15/2009) Creat: : 0.83 (02/15/2009)   Chol: 192 (11/09/2008)   HDL: 44.80 (11/09/2008)   LDL: 126 (11/09/2008)   TG: 104.0 (11/09/2008)  Problem # 4:  DEPRESSION (ICD-311)  Her updated medication list for this problem includes:    Wellbutrin Xl 150 Mg Xr24h-tab (Bupropion hcl) .Marland KitchenMarland KitchenMarland KitchenMarland Kitchen 3 by mouth once daily  Complete Medication List: 1)  Wellbutrin Xl 150 Mg Xr24h-tab (Bupropion hcl) .... 3 by mouth once daily 2)  Metoprolol Tartrate 100 Mg Tabs (Metoprolol tartrate) .... Take 1 tablet by mouth two times a day 3)  Aspirin 325 Mg Tabs (Aspirin) .Marland Kitchen.. 1 by mouth once daily 4)  Furosemide 20 Mg Tabs (Furosemide) .... 3 tablets by mouth daily 5)  Benicar 20 Mg Tabs (Olmesartan medoxomil) .Marland Kitchen.. 1 by mouth once daily 6)  Ultram 50 Mg Tabs (Tramadol hcl) .Marland Kitchen.. 1-2 by mouth every 6 hours as needed 7)  Vitamin D (ergocalciferol) 50000 Unit Caps (Ergocalciferol) .Marland Kitchen.. 1 by mouth weekly. 8)  Vitamin D3 2000 Unit Caps (Cholecalciferol) .Marland Kitchen.. 1 by mouth once daily.  Other Orders: Gastroenterology Referral (GI)  Patient Instructions: 1)  Please schedule a follow-up appointment in 2 weeks.  Prescriptions: ULTRAM 50 MG TABS (TRAMADOL HCL) 1-2 by mouth every 6 hours as needed  #60 x 1   Entered and Authorized by:   Loreen Freud DO   Signed by:   Loreen Freud DO on 01/31/2010   Method used:   [facility]        [facility] [phone][facility] (retail)       [address] [place]  [address]       Ph: [phone] or [phone]       Fax: [phone]   RxID:   [id number] WELLBUTRIN XL 150 MG XR24H-TAB (BUPROPION HCL) 3 by mouth once daily  #90 x 2   Entered and Authorized by:   Loreen Freud DO   Signed  by:   Loreen Freud DO on 01/31/2010   Method used:   [facility]        [facility] [phone]* (retail)       [address]       [place], [place]  [address]       Ph: [phone] or [phone]       Fax: [phone]   RxID:   [id number]

## 2010-07-08 NOTE — Assessment & Plan Note (Signed)
Summary: 2 WKS OV//PH   Vital Signs:  Patient profile:   65 year old female Height:      69 inches Weight:      271.4 pounds Temp:     98.5 degrees F oral Pulse rate:   72 / minute Pulse rhythm:   regular BP sitting:   146 / 82  (left arm)  Vitals Entered By: Almeta Monas CMA Duncan Dull) (February 14, 2010 10:39 AM) CC: 2 wk f/u on meds   History of Present Illness:  Hypertension follow-up      This is a 65 year old woman who presents for Hypertension follow-up.  The patient denies lightheadedness, urinary frequency, headaches, edema, impotence, rash, and fatigue.  The patient denies the following associated symptoms: chest pain, chest pressure, exercise intolerance, dyspnea, palpitations, syncope, leg edema, and pedal edema.  Compliance with medications (by patient report) has been near 100%.  The patient reports that dietary compliance has been good.  The patient reports no exercise.  Adjunctive measures currently used by the patient include salt restriction.    Current Medications (verified): 1)  Wellbutrin Xl 150 Mg Xr24h-Tab (Bupropion Hcl) .... 3 By Mouth Once Daily 2)  Metoprolol Tartrate 100 Mg Tabs (Metoprolol Tartrate) .... Take 1 Tablet By Mouth Two Times A Day 3)  Aspirin 325 Mg Tabs (Aspirin) .Marland Kitchen.. 1 By Mouth Once Daily 4)  Furosemide 20 Mg Tabs (Furosemide) .... 3 Tablets By Mouth Daily 5)  Benicar 20 Mg Tabs (Olmesartan Medoxomil) .Marland Kitchen.. 1 By Mouth Once Daily 6)  Ultram 50 Mg Tabs (Tramadol Hcl) .Marland Kitchen.. 1-2 By Mouth Every 6 Hours As Needed 7)  Vitamin D (Ergocalciferol) 50000 Unit Caps (Ergocalciferol) .Marland Kitchen.. 1 By Mouth Weekly. 8)  Vitamin D3 2000 Unit Caps (Cholecalciferol) .Marland Kitchen.. 1 By Mouth Once Daily.  Allergies (verified): 1)  * Norvasc  Past History:  Past medical, surgical, family and social histories (including risk factors) reviewed for relevance to current acute and chronic problems.  Past Medical History: Reviewed history from 02/25/2009 and no changes  required.  SNORING (ICD-786.09) INSOMNIA (ICD-780.52) ABNORMAL ELECTROCARDIOGRAM (ICD-794.31) ARM PAIN, LEFT (ICD-729.5) CHEST PAIN UNSPECIFIED (ICD-786.50) EDEMA (ICD-782.3) KNEE PAIN (ICD-719.46) LEG PAIN, LEFT (ICD-729.5) FOOT PAIN, RIGHT (ICD-729.5) HX, URINARY INFECTION (ICD-V13.02) MENOPAUSAL SYNDROME (ICD-627.2) PREVENTIVE HEALTH CARE (ICD-V70.0) HYPERTENSION (ICD-401.9) DEPRESSION (ICD-311)    Past Surgical History: Reviewed history from 02/25/2009 and no changes required. Tubal ligation 1970 Inguinal herniorrhaphy (2000)  Family History: Reviewed history from 02/25/2009 and no changes required. Family History Diabetes 1st degree relative Family History Hypertension Family History Liver disease M--Panc. CA Family History of Alcoholism/Addiction Family History Depression  asthma: son cancer: mother (pancreatic) maternal grandmother (breast)   Social History: Reviewed history from 02/11/2007 and no changes required. Occupation: [employer] at [employer], 3 children Never Smoked Alcohol use-yes Drug use-no Regular exercise-yes  Review of Systems      See HPI  Physical Exam  General:  Well-developed,well-nourished,in no acute distress; alert,appropriate and cooperative throughout examination Lungs:  Normal respiratory effort, chest expands symmetrically. Lungs are clear to auscultation, no crackles or wheezes. Heart:  Normal rate and regular rhythm. S1 and S2 normal without gallop, murmur, click, rub or other extra sounds. Extremities:  No clubbing, cyanosis, edema, or deformity noted with normal full range of motion of all joints.   Psych:  Oriented X3 and normally interactive.     Impression & Recommendations:  Problem # 1:  HYPERTENSION (ICD-401.9)  Her updated medication list for this problem includes:  Metoprolol Tartrate 100 Mg Tabs (Metoprolol tartrate) .Marland Kitchen... Take 1 tablet by mouth two times a day    Furosemide 20 Mg Tabs (Furosemide) .Marland KitchenMarland KitchenMarland KitchenMarland Kitchen  3 tablets by mouth daily    Benicar 20 Mg Tabs (Olmesartan medoxomil) .Marland Kitchen... 1 by mouth once daily  BP today: 146/82 Prior BP: 152/96 (01/31/2010)  Labs Reviewed: K+: 4.2 (02/15/2009) Creat: : 0.83 (02/15/2009)   Chol: 192 (11/09/2008)   HDL: 44.80 (11/09/2008)   LDL: 126 (11/09/2008)   TG: 104.0 (11/09/2008)  Complete Medication List: 1)  Wellbutrin Xl 150 Mg Xr24h-tab (Bupropion hcl) .... 3 by mouth once daily 2)  Metoprolol Tartrate 100 Mg Tabs (Metoprolol tartrate) .... Take 1 tablet by mouth two times a day 3)  Aspirin 325 Mg Tabs (Aspirin) .Marland Kitchen.. 1 by mouth once daily 4)  Furosemide 20 Mg Tabs (Furosemide) .... 3 tablets by mouth daily 5)  Benicar 20 Mg Tabs (Olmesartan medoxomil) .Marland Kitchen.. 1 by mouth once daily 6)  Ultram 50 Mg Tabs (Tramadol hcl) .Marland Kitchen.. 1-2 by mouth every 6 hours as needed 7)  Vitamin D (ergocalciferol) 50000 Unit Caps (Ergocalciferol) .Marland Kitchen.. 1 by mouth weekly. 8)  Vitamin D3 2000 Unit Caps (Cholecalciferol) .Marland Kitchen.. 1 by mouth once daily.  Patient Instructions: 1)  Please schedule a follow-up appointment in 3 months .  Prescriptions: BENICAR 20 MG TABS (OLMESARTAN MEDOXOMIL) 1 by mouth once daily  #30 x 2   Entered and Authorized by:   Loreen Freud DO   Signed by:   Loreen Freud DO on 02/14/2010   Method used:   Print then Give to Patient   RxID:   [id number]

## 2010-07-31 ENCOUNTER — Ambulatory Visit (INDEPENDENT_AMBULATORY_CARE_PROVIDER_SITE_OTHER): Payer: BC Managed Care – PPO | Admitting: Family Medicine

## 2010-07-31 ENCOUNTER — Other Ambulatory Visit: Payer: Self-pay | Admitting: Family Medicine

## 2010-07-31 ENCOUNTER — Encounter: Payer: Self-pay | Admitting: Family Medicine

## 2010-07-31 DIAGNOSIS — I10 Essential (primary) hypertension: Secondary | ICD-10-CM

## 2010-08-01 LAB — BASIC METABOLIC PANEL
GFR: 98.87 mL/min (ref >60.00)
Potassium: 4.3 mEq/L (ref 3.5–5.1)
Sodium: 144 mEq/L (ref 135–145)

## 2010-08-05 NOTE — Assessment & Plan Note (Signed)
Summary: for med refill--ph   Vital Signs:  Patient profile:   65 year old female Weight:      280.2 pounds Pulse rate:   72 / minute Pulse rhythm:   regular BP sitting:   138 / 90  (left arm) Cuff size:   large  Vitals Entered By: Almeta Monas CMA Duncan Dull) (July 31, 2010 3:01 PM) CC: Medication Review   History of Present Illness:  Hypertension follow-up      This is a 65 year old woman who presents for Hypertension follow-up.  The patient denies lightheadedness, urinary frequency, headaches, edema, impotence, rash, and fatigue.  The patient denies the following associated symptoms: chest pain, chest pressure, exercise intolerance, dyspnea, palpitations, syncope, leg edema, and pedal edema.  Compliance with medications (by patient report) has been near 100%.  The patient reports that dietary compliance has been good.  The patient reports exercising occasionally.  Adjunctive measures currently used by the patient include salt restriction.    Current Medications (verified): 1)  Wellbutrin Xl 150 Mg Xr24h-Tab (Bupropion Hcl) .... 3 By Mouth Once Daily 2)  Metoprolol Tartrate 100 Mg Tabs (Metoprolol Tartrate) .... Take 1 Tablet By Mouth Two Times A Day 3)  Furosemide 20 Mg Tabs (Furosemide) .... 3 Tablets By Mouth Daily 4)  Cozaar 100 Mg Tabs (Losartan Potassium) .Marland Kitchen.. 1 By Mouth Once Daily 5)  Ultram 50 Mg Tabs (Tramadol Hcl) .Marland Kitchen.. 1-2 By Mouth Every 6 Hours As Needed 6)  Vitamin D3 2000 Unit Caps (Cholecalciferol) .Marland Kitchen.. 1 By Mouth Once Daily.  Allergies (verified): 1)  * Norvasc  Past History:  Past medical, surgical, family and social histories (including risk factors) reviewed for relevance to current acute and chronic problems.  Past Medical History: Reviewed history from 02/25/2009 and no changes required.  SNORING (ICD-786.09) INSOMNIA (ICD-780.52) ABNORMAL ELECTROCARDIOGRAM (ICD-794.31) ARM PAIN, LEFT (ICD-729.5) CHEST PAIN UNSPECIFIED (ICD-786.50) EDEMA  (ICD-782.3) KNEE PAIN (ICD-719.46) LEG PAIN, LEFT (ICD-729.5) FOOT PAIN, RIGHT (ICD-729.5) HX, URINARY INFECTION (ICD-V13.02) MENOPAUSAL SYNDROME (ICD-627.2) PREVENTIVE HEALTH CARE (ICD-V70.0) HYPERTENSION (ICD-401.9) DEPRESSION (ICD-311)    Past Surgical History: Reviewed history from 02/25/2009 and no changes required. Tubal ligation 1970 Inguinal herniorrhaphy (2000)  Family History: Reviewed history from 02/25/2009 and no changes required. Family History Diabetes 1st degree relative Family History Hypertension Family History Liver disease M--Panc. CA Family History of Alcoholism/Addiction Family History Depression  asthma: son cancer: mother (pancreatic) maternal grandmother (breast)   Social History: Reviewed history from 02/11/2007 and no changes required. Occupation: [employer] at [employer], 3 children Never Smoked Alcohol use-yes Drug use-no Regular exercise-yes  Review of Systems      See HPI  Physical Exam  General:  Well-developed,well-nourished,in no acute distress; alert,appropriate and cooperative throughout examination Neck:  No deformities, masses, or tenderness noted. Lungs:  Normal respiratory effort, chest expands symmetrically. Lungs are clear to auscultation, no crackles or wheezes. Heart:  Normal rate and regular rhythm. S1 and S2 normal without gallop, murmur, click, rub or other extra sounds. Extremities:  No clubbing, cyanosis, edema, or deformity noted with normal full range of motion of all joints.     Impression & Recommendations:  Problem # 1:  HYPERTENSION (ICD-401.9)  Her updated medication list for this problem includes:    Metoprolol Tartrate 100 Mg Tabs (Metoprolol tartrate) .Marland Kitchen... Take 1 tablet by mouth two times a day    Furosemide 20 Mg Tabs (Furosemide) .Marland KitchenMarland KitchenMarland KitchenMarland Kitchen 3 tablets by mouth daily    Cozaar 100 Mg Tabs (Losartan potassium) .Marland Kitchen... 1 by  mouth once daily  Orders: Venipuncture (528[address]) TLB-BMP (Basic Metabolic  Panel-BMET) (80048-METABOL) Specimen Handling (32440)  BP today: 138/90 Prior BP: 146/82 (02/14/2010)  Labs Reviewed: K+: 4.2 (02/15/2009) Creat: : 0.83 (02/15/2009)   Chol: 192 (11/09/2008)   HDL: 44.80 (11/09/2008)   LDL: 126 (11/09/2008)   TG: 104.0 (11/09/2008)  Problem # 2:  DEPRESSION (ICD-311)  Her updated medication list for this problem includes:    Wellbutrin Xl 150 Mg Xr24h-tab (Bupropion hcl) .Marland KitchenMarland KitchenMarland KitchenMarland Kitchen 3 by mouth once daily  Complete Medication List: 1)  Wellbutrin Xl 150 Mg Xr24h-tab (Bupropion hcl) .... 3 by mouth once daily 2)  Metoprolol Tartrate 100 Mg Tabs (Metoprolol tartrate) .... Take 1 tablet by mouth two times a day 3)  Furosemide 20 Mg Tabs (Furosemide) .... 3 tablets by mouth daily 4)  Cozaar 100 Mg Tabs (Losartan potassium) .Marland Kitchen.. 1 by mouth once daily 5)  Ultram 50 Mg Tabs (Tramadol hcl) .Marland Kitchen.. 1-2 by mouth every 6 hours as needed 6)  Vitamin D3 2000 Unit Caps (Cholecalciferol) .Marland Kitchen.. 1 by mouth once daily.  Patient Instructions: 1)  rto 2-3 weeks for bp check Prescriptions: COZAAR 100 MG TABS (LOSARTAN POTASSIUM) 1 by mouth once daily  #90 x 3   Entered and Authorized by:   Loreen Freud DO   Signed by:   Loreen Freud DO on 07/31/2010   Method used:   [facility]        [facility] [phone]* (retail)       [address] [place]  [address]       Ph: [phone] or [phone]       Fax: [phone]   RxID:   [id number] FUROSEMIDE 20 MG TABS (FUROSEMIDE) 3 tablets by mouth daily  #90 x 3   Entered and Authorized by:   Loreen Freud DO   Signed by:   Loreen Freud DO on 07/31/2010   Method used:   [facility]        [facility] [phone]* (retail)       [address], [place]  [address]       Ph: [phone] or [phone]       Fax: [phone]   RxID:   [id number] METOPROLOL TARTRATE 100 MG TABS (METOPROLOL TARTRATE) Take 1 tablet by mouth two times a day  #180 Tablet x 3   Entered and Authorized by:    Loreen Freud DO   Signed by:   Loreen Freud DO on 07/31/2010   Method used:   [facility]        [facility] [phone]* (retail)       [address], [place]  [address]       Ph: [phone] or [phone]       Fax: [phone]   RxID:   [id number] WELLBUTRIN XL 150 MG XR24H-TAB (BUPROPION HCL) 3 by mouth once daily  #90 x 5   Entered and Authorized by:   Loreen Freud DO   Signed by:   Loreen Freud DO on 07/31/2010   Method used:   [facility] to        [facility] [phone]* (retail)       [address]       [place], [place]  [address]       Ph: [phone] or [phone]  Fax: [phone]   RxID:   [id number]    Orders Added: 1)  Venipuncture [84696] 2)  TLB-BMP (Basic Metabolic Panel-BMET) [80048-METABOL] 3)  Specimen Handling [99000] 4)  Est. Patient Level III [29528]

## 2010-08-21 ENCOUNTER — Ambulatory Visit: Payer: BC Managed Care – PPO | Admitting: Family Medicine

## 2010-08-29 ENCOUNTER — Ambulatory Visit: Payer: BC Managed Care – PPO | Admitting: Family Medicine

## 2010-08-29 ENCOUNTER — Ambulatory Visit: Payer: BC Managed Care – PPO

## 2010-09-01 ENCOUNTER — Encounter: Payer: Self-pay | Admitting: Family Medicine

## 2010-09-03 ENCOUNTER — Encounter: Payer: Self-pay | Admitting: Family Medicine

## 2010-09-03 ENCOUNTER — Ambulatory Visit (INDEPENDENT_AMBULATORY_CARE_PROVIDER_SITE_OTHER): Payer: BC Managed Care – PPO | Admitting: Family Medicine

## 2010-09-03 VITALS — BP 160/92 | HR 88 | Wt 283.4 lb

## 2010-09-03 DIAGNOSIS — I10 Essential (primary) hypertension: Secondary | ICD-10-CM

## 2010-09-03 DIAGNOSIS — F3289 Other specified depressive episodes: Secondary | ICD-10-CM

## 2010-09-03 DIAGNOSIS — F329 Major depressive disorder, single episode, unspecified: Secondary | ICD-10-CM

## 2010-09-03 MED ORDER — VALSARTAN 160 MG PO TABS: 160.0000 mg | ORAL_TABLET | Freq: Every day | ORAL | Status: DC

## 2010-09-03 NOTE — Progress Notes (Signed)
  Subjective:    Patient ID: Michaela Martinez, female    DOB: 22-Jan-1946, 65 y.o.   MRN: [id number]  HPI Pt here f/u depression and wellbutrin.  Pt states she is feeling better. Bp high today.  No other complaints.      Review of Systems  Constitutional: Negative.   Respiratory: Negative for chest tightness and shortness of breath.   Cardiovascular: Negative for chest pain and palpitations.  Psychiatric/Behavioral: Negative.        Objective:   Physical Exam  Constitutional: She is oriented to person, place, and time. She appears well-developed and well-nourished. No distress.  Pulmonary/Chest: Effort normal and breath sounds normal. No respiratory distress. She exhibits no tenderness.  Musculoskeletal: She exhibits no edema.  Neurological: She is alert and oriented to person, place, and time.  Psychiatric: She has a normal mood and affect. Her behavior is normal. Judgment and thought content normal. Her mood appears not anxious. Her affect is not angry and not inappropriate. She does not exhibit a depressed mood.          Assessment & Plan:

## 2010-09-03 NOTE — Assessment & Plan Note (Signed)
Improving  con't meds  

## 2010-09-03 NOTE — Patient Instructions (Signed)

## 2010-09-12 LAB — CK TOTAL AND CKMB (NOT AT ARMC)
CK, MB: 3.2 ng/mL (ref 0.3–4.0)
Relative Index: 1.2 (ref 0.0–2.5)
Total CK: 268 U/L — ABNORMAL HIGH (ref 7–177)

## 2010-09-12 LAB — COMPREHENSIVE METABOLIC PANEL
AST: 20 U/L (ref 0–37)
Albumin: 3.5 g/dL (ref 3.5–5.2)
Calcium: 8.9 mg/dL (ref 8.4–10.5)
Creatinine, Ser: 0.57 mg/dL (ref 0.4–1.2)
GFR calc Af Amer: >60 mL/min (ref >60)
Total Protein: 6.9 g/dL (ref 6.0–8.3)

## 2010-09-12 LAB — CARDIAC PANEL(CRET KIN+CKTOT+MB+TROPI)
CK, MB: 2.9 ng/mL (ref 0.3–4.0)
Total CK: 193 U/L — ABNORMAL HIGH (ref 7–177)
Total CK: 225 U/L — ABNORMAL HIGH (ref 7–177)
Troponin I: 0.02 ng/mL (ref 0.00–0.06)

## 2010-09-12 LAB — CBC
HCT: 38.9 % (ref 36.0–46.0)
Hemoglobin: 12.7 g/dL (ref 12.0–15.0)
RDW: 14.6 % (ref 11.5–15.5)

## 2010-09-12 LAB — URINALYSIS, ROUTINE W REFLEX MICROSCOPIC
Bilirubin Urine: NEGATIVE
Glucose, UA: NEGATIVE mg/dL
Ketones, ur: NEGATIVE mg/dL
Nitrite: NEGATIVE
pH: 6 (ref 5.0–8.0)

## 2010-09-12 LAB — DIFFERENTIAL
Basophils Absolute: 0 10*3/uL (ref 0.0–0.1)
Eosinophils Relative: 2 % (ref 0–5)
Lymphocytes Relative: 25 % (ref 12–46)
Monocytes Absolute: 0.6 10*3/uL (ref 0.1–1.0)

## 2010-09-12 LAB — URINE MICROSCOPIC-ADD ON

## 2010-09-29 ENCOUNTER — Encounter: Payer: Self-pay | Admitting: Family Medicine

## 2010-10-01 ENCOUNTER — Ambulatory Visit (INDEPENDENT_AMBULATORY_CARE_PROVIDER_SITE_OTHER): Payer: BC Managed Care – PPO | Admitting: Family Medicine

## 2010-10-01 ENCOUNTER — Encounter: Payer: Self-pay | Admitting: Family Medicine

## 2010-10-01 VITALS — BP 160/90 | HR 76 | Wt 279.2 lb

## 2010-10-01 DIAGNOSIS — I10 Essential (primary) hypertension: Secondary | ICD-10-CM

## 2010-10-01 MED ORDER — VALSARTAN 160 MG PO TABS: 160.0000 mg | ORAL_TABLET | Freq: Every day | ORAL | Status: DC

## 2010-10-01 NOTE — Assessment & Plan Note (Signed)
D/w pt taking meds and not running out con't diet and exercise  rto 2-3 weeks

## 2010-10-01 NOTE — Progress Notes (Signed)
  Subjective:    Patient here for follow-up of elevated blood pressure.  She is exercising and is adherent to a low-salt diet.  Blood pressure is not well controlled at home. Cardiac symptoms: none. Patient denies: chest pain, chest pressure/discomfort, dyspnea, fatigue, irregular heart beat and palpitations. Cardiovascular risk factors: advanced age (older than 25 for men, 26 for women), hypertension and obesity (BMI >= 30 kg/m2). Use of agents associated with hypertension: none. History of target organ damage: none.  The following portions of the patient's history were reviewed and updated as appropriate: allergies, current medications, past family history, past medical history, past social history, past surgical history and problem list.  Review of Systems Pertinent items are noted in HPI.     Objective:    BP 160/90  Pulse 76  Wt 279 lb 3.2 oz (126.644 kg) General appearance: alert, cooperative, appears stated age and no distress Neck: no adenopathy, no carotid bruit, no JVD, supple, symmetrical, trachea midline and thyroid not enlarged, symmetric, no tenderness/mass/nodules Lungs: clear to auscultation bilaterally Heart: regular rate and rhythm, S1, S2 normal, no murmur, click, rub or gallop Extremities: + edema b/L slightly improved   Assessment:    Hypertension, uncontrolled . Evidence of target organ damage: none.    Plan:    Medication: no change and d/w pt taking meds and not running out. Dietary sodium restriction. Regular aerobic exercise. Check blood pressures 2-3 times weekly and record. Follow up: 2 weeks and as needed.

## 2010-10-01 NOTE — Patient Instructions (Signed)

## 2010-10-14 ENCOUNTER — Other Ambulatory Visit: Payer: Self-pay | Admitting: Family Medicine

## 2010-10-14 ENCOUNTER — Ambulatory Visit (INDEPENDENT_AMBULATORY_CARE_PROVIDER_SITE_OTHER): Payer: BC Managed Care – PPO | Admitting: Family Medicine

## 2010-10-14 ENCOUNTER — Encounter: Payer: Self-pay | Admitting: Family Medicine

## 2010-10-14 VITALS — BP 140/82 | Temp 98.8°F | Wt 280.4 lb

## 2010-10-14 DIAGNOSIS — I10 Essential (primary) hypertension: Secondary | ICD-10-CM

## 2010-10-14 DIAGNOSIS — E785 Hyperlipidemia, unspecified: Secondary | ICD-10-CM

## 2010-10-14 LAB — BASIC METABOLIC PANEL
CO2: 35 mEq/L — ABNORMAL HIGH (ref 19–32)
Calcium: 9 mg/dL (ref 8.4–10.5)
Creatinine, Ser: 0.9 mg/dL (ref 0.4–1.2)
Glucose, Bld: 90 mg/dL (ref 70–99)

## 2010-10-14 LAB — POCT URINALYSIS DIPSTICK
Bilirubin, UA: NEGATIVE
Ketones, UA: NEGATIVE
Protein, UA: NEGATIVE
Spec Grav, UA: 1.005

## 2010-10-14 LAB — LIPID PANEL: HDL: 42.8 mg/dL (ref >39.00)

## 2010-10-14 LAB — HEPATIC FUNCTION PANEL
ALT: 16 U/L (ref 0–35)
Albumin: 3.7 g/dL (ref 3.5–5.2)
Alkaline Phosphatase: 73 U/L (ref 39–117)
Total Protein: 6.9 g/dL (ref 6.0–8.3)

## 2010-10-14 LAB — LDL CHOLESTEROL, DIRECT: Direct LDL: 148 mg/dL

## 2010-10-14 NOTE — Progress Notes (Signed)
  Subjective:    Patient here for follow-up of elevated blood pressure.  She is exercising and is adherent to a low-salt diet.  Blood pressure not being checked at home. Cardiac symptoms: fatigue. Patient denies: chest pain, chest pressure/discomfort, exertional chest pressure/discomfort, irregular heart beat and palpitations. Cardiovascular risk factors: advanced age (older than 63 for men, 67 for women), dyslipidemia, hypertension and obesity (BMI >= 30 kg/m2). Use of agents associated with hypertension: none. History of target organ damage: none.  The following portions of the patient's history were reviewed and updated as appropriate: allergies, current medications, past family history, past medical history, past social history, past surgical history and problem list.  Review of Systems Pertinent items are noted in HPI.     Objective:    BP 140/82  Temp(Src) 98.8 F (37.1 C) (Oral)  Wt 280 lb 6.4 oz (127.189 kg) General appearance: alert, cooperative, appears stated age, fatigued, no distress and morbidly obese Neck: no adenopathy, no carotid bruit, no JVD, supple, symmetrical, trachea midline and thyroid not enlarged, symmetric, no tenderness/mass/nodules Lungs: clear to auscultation bilaterally Heart: regular rate and rhythm, S1, S2 normal, no murmur, click, rub or gallop Extremities: edema L > R    Assessment:    Hypertension, normal blood pressure . Evidence of target organ damage: none.    Plan:    Medication: no change. Dietary sodium restriction. Regular aerobic exercise. Check blood pressures 2-3 times weekly and record. f/u 3 months or sooner prn

## 2010-10-15 ENCOUNTER — Encounter: Payer: Self-pay | Admitting: *Deleted

## 2010-10-15 NOTE — Telephone Encounter (Signed)
Seen 10/14/10 and filled 01/31/10 # 60 with 1 refill---Please advise     KP

## 2010-10-22 ENCOUNTER — Telehealth: Payer: Self-pay | Admitting: Family Medicine

## 2010-10-22 DIAGNOSIS — I10 Essential (primary) hypertension: Secondary | ICD-10-CM

## 2010-10-22 MED ORDER — VALSARTAN 160 MG PO TABS: 160.0000 mg | ORAL_TABLET | Freq: Every day | ORAL | Status: DC

## 2010-10-22 NOTE — Telephone Encounter (Signed)
Diovan 160 # 90 [facility] [address]       KP

## 2010-10-22 NOTE — Telephone Encounter (Signed)
Pt states that she was given samples of Diovian 160mg . She is completely out of samples and needs a RX called [facility]. Pt is requesting #90 so she can use the discount card.

## 2010-11-04 ENCOUNTER — Other Ambulatory Visit: Payer: Self-pay | Admitting: Family Medicine

## 2010-11-04 MED ORDER — TRAMADOL HCL 50 MG PO TABS: 50.0000 mg | ORAL_TABLET | Freq: Four times a day (QID) | ORAL | Status: DC | PRN

## 2010-11-04 MED ORDER — METOPROLOL TARTRATE 100 MG PO TABS: 100.0000 mg | ORAL_TABLET | Freq: Two times a day (BID) | ORAL | Status: DC

## 2011-01-14 ENCOUNTER — Ambulatory Visit (INDEPENDENT_AMBULATORY_CARE_PROVIDER_SITE_OTHER): Payer: BC Managed Care – PPO | Admitting: Family Medicine

## 2011-01-14 ENCOUNTER — Encounter: Payer: Self-pay | Admitting: Family Medicine

## 2011-01-14 VITALS — BP 180/92 | HR 78 | Temp 97.7°F | Wt 276.0 lb

## 2011-01-14 DIAGNOSIS — F329 Major depressive disorder, single episode, unspecified: Secondary | ICD-10-CM

## 2011-01-14 DIAGNOSIS — F32A Depression, unspecified: Secondary | ICD-10-CM

## 2011-01-14 DIAGNOSIS — F3289 Other specified depressive episodes: Secondary | ICD-10-CM

## 2011-01-14 DIAGNOSIS — I10 Essential (primary) hypertension: Secondary | ICD-10-CM

## 2011-01-14 MED ORDER — VALSARTAN 320 MG PO TABS: 320.0000 mg | ORAL_TABLET | Freq: Every day | ORAL | Status: DC

## 2011-01-14 MED ORDER — BUPROPION HCL ER (XL) 150 MG PO TB24: 450.0000 mg | ORAL_TABLET | Freq: Every day | ORAL | Status: DC

## 2011-01-14 MED ORDER — FUROSEMIDE 20 MG PO TABS: ORAL_TABLET | ORAL | Status: DC

## 2011-01-14 MED ORDER — METOPROLOL TARTRATE 100 MG PO TABS: 100.0000 mg | ORAL_TABLET | Freq: Two times a day (BID) | ORAL | Status: DC

## 2011-01-14 NOTE — Patient Instructions (Signed)

## 2011-01-14 NOTE — Progress Notes (Signed)
  Subjective:    Patient here for follow-up of elevated blood pressure.  She is not exercising and is adherent to a low-salt diet.  Blood pressure not checked  at home. Cardiac symptoms: none. Patient denies: chest pain, chest pressure/discomfort, claudication, dyspnea, exertional chest pressure/discomfort, fatigue, irregular heart beat, lower extremity edema, near-syncope, orthopnea, palpitations, paroxysmal nocturnal dyspnea, syncope and tachypnea. Cardiovascular risk factors: dyslipidemia, hypertension, obesity (BMI >= 30 kg/m2) and sedentary lifestyle. Use of agents associated with hypertension: none. History of target organ damage: none.  The following portions of the patient's history were reviewed and updated as appropriate: allergies, current medications, past family history, past medical history, past social history, past surgical history and problem list.  Review of Systems Pertinent items are noted in HPI.     Objective:    BP 180/92  Pulse 78  Temp(Src) 97.7 F (36.5 C) (Oral)  Wt 276 lb (125.193 kg) General appearance: alert, cooperative, appears stated age and no distress Lungs: clear to auscultation bilaterally Heart: S1, S2 normal Extremities: edema b/l pitting    Assessment:    Hypertension, stage 1 . Evidence of target organ damage: none.    Plan:    Medication: increase to diovan 320. Dietary sodium restriction. Regular aerobic exercise. Check blood pressures 2-3 times weekly and record. Follow up: 2 weeks and as needed.

## 2011-02-02 ENCOUNTER — Encounter: Payer: Self-pay | Admitting: Family Medicine

## 2011-02-02 ENCOUNTER — Ambulatory Visit (INDEPENDENT_AMBULATORY_CARE_PROVIDER_SITE_OTHER): Payer: BC Managed Care – PPO | Admitting: Family Medicine

## 2011-02-02 VITALS — BP 150/84 | HR 73 | Temp 98.4°F | Wt 273.4 lb

## 2011-02-02 DIAGNOSIS — I10 Essential (primary) hypertension: Secondary | ICD-10-CM

## 2011-02-02 MED ORDER — AMLODIPINE BESYLATE-VALSARTAN 5-160 MG PO TABS: 1.0000 | ORAL_TABLET | Freq: Every day | ORAL | Status: DC

## 2011-02-02 MED ORDER — FUROSEMIDE 20 MG PO TABS: ORAL_TABLET | ORAL | Status: DC

## 2011-02-02 NOTE — Progress Notes (Signed)
  Subjective:    Patient here for follow-up of elevated blood pressure.  She is not exercising and is adherent to a low-salt diet.  Blood pressure not checking at home. Cardiac symptoms: none. Patient denies: chest pain, chest pressure/discomfort, claudication, dyspnea, exertional chest pressure/discomfort, fatigue, irregular heart beat, near-syncope, orthopnea, palpitations, paroxysmal nocturnal dyspnea, syncope and tachypnea. Cardiovascular risk factors: dyslipidemia, hypertension, obesity (BMI >= 30 kg/m2) and sedentary lifestyle. Use of agents associated with hypertension: none. History of target organ damage: none.  The following portions of the patient's history were reviewed and updated as appropriate: allergies, current medications, past family history, past medical history, past social history, past surgical history and problem list.  Review of Systems Pertinent items are noted in HPI.     Objective:    BP 150/84  Pulse 73  Temp(Src) 98.4 F (36.9 C) (Oral)  Wt 273 lb 6.4 oz (124.013 kg)  SpO2 97% General appearance: alert, cooperative, appears stated age, mild distress and morbidly obese Neck: no adenopathy, no carotid bruit, no JVD, supple, symmetrical, trachea midline and thyroid not enlarged, symmetric, no tenderness/mass/nodules Lungs: clear to auscultation bilaterally Heart: S1, S2 normal Extremities: edema + pitting edema-----improved    Assessment:    Hypertension, stage 1 . Evidence of target organ damage: none.    Plan:    Medication: increase to exforge. Dietary sodium restriction. Regular aerobic exercise. Check blood pressures 2-3 times weekly and record. Follow up: 2 weeks and as needed.

## 2011-02-02 NOTE — Patient Instructions (Addendum)

## 2011-03-16 ENCOUNTER — Encounter: Payer: Self-pay | Admitting: Family Medicine

## 2011-03-16 ENCOUNTER — Ambulatory Visit (INDEPENDENT_AMBULATORY_CARE_PROVIDER_SITE_OTHER): Payer: BC Managed Care – PPO | Admitting: Family Medicine

## 2011-03-16 VITALS — BP 170/61 | HR 69 | Temp 98.9°F | Wt 277.2 lb

## 2011-03-16 DIAGNOSIS — E785 Hyperlipidemia, unspecified: Secondary | ICD-10-CM

## 2011-03-16 DIAGNOSIS — I10 Essential (primary) hypertension: Secondary | ICD-10-CM

## 2011-03-16 MED ORDER — PRAVASTATIN SODIUM 20 MG PO TABS: 20.0000 mg | ORAL_TABLET | Freq: Every evening | ORAL | Status: DC

## 2011-03-16 MED ORDER — AMLODIPINE BESYLATE-VALSARTAN 10-160 MG PO TABS: 1.0000 | ORAL_TABLET | Freq: Every day | ORAL | Status: DC

## 2011-03-16 NOTE — Patient Instructions (Signed)

## 2011-03-16 NOTE — Assessment & Plan Note (Signed)
Start pravachol 20mg  1 po qhs  Recheck labs in 3 months

## 2011-03-16 NOTE — Progress Notes (Signed)
  Subjective:    Patient ID: Michaela Martinez, female    DOB: August 09, 1945, 65 y.o.   MRN: [id number]  HPI    Review of Systems     Objective:   Physical Exam        Assessment & Plan:

## 2011-03-16 NOTE — Progress Notes (Signed)
  Subjective:    Patient here for follow-up of elevated blood pressure.  She is not exercising and is adherent to a low-salt diet.  Blood pressure is not well controlled at home. Cardiac symptoms: none. Patient denies: chest pain, chest pressure/discomfort, claudication, dyspnea, exertional chest pressure/discomfort, fatigue, irregular heart beat, lower extremity edema, near-syncope, orthopnea, palpitations, paroxysmal nocturnal dyspnea, syncope and tachypnea. Cardiovascular risk factors: dyslipidemia, hypertension, obesity (BMI >= 30 kg/m2) and sedentary lifestyle. Use of agents associated with hypertension: none. History of target organ damage: none.  The following portions of the patient's history were reviewed and updated as appropriate: allergies, current medications, past family history, past medical history, past social history, past surgical history and problem list.  Review of Systems Pertinent items are noted in HPI.     Objective:    BP 170/61  Pulse 69  Temp(Src) 98.9 F (37.2 C) (Oral)  Wt 277 lb 3.2 oz (125.737 kg)  SpO2 98% General appearance: alert, cooperative, appears stated age and no distress Head: Normocephalic, without obvious abnormality, atraumatic Lungs: clear to auscultation bilaterally Heart: regular rate and rhythm, S1, S2 normal, no murmur, click, rub or gallop Extremities: + pitting edema L >R LE   Assessment:    Hypertension, stage 2 . Evidence of target organ damage: none.    Plan:    Medication: increase to exforge 10/160. Dietary sodium restriction. Regular aerobic exercise. Check blood pressures 2-3 times weekly and record. Follow up: 2 weeks and as needed.

## 2011-03-17 ENCOUNTER — Other Ambulatory Visit: Payer: Self-pay | Admitting: Family Medicine

## 2011-03-17 DIAGNOSIS — I10 Essential (primary) hypertension: Secondary | ICD-10-CM

## 2011-03-17 MED ORDER — METOPROLOL TARTRATE 100 MG PO TABS: 100.0000 mg | ORAL_TABLET | Freq: Two times a day (BID) | ORAL | Status: DC

## 2011-03-17 MED ORDER — FUROSEMIDE 20 MG PO TABS: ORAL_TABLET | ORAL | Status: DC

## 2011-03-17 NOTE — Telephone Encounter (Signed)
Faxed.   KP 

## 2011-03-19 ENCOUNTER — Encounter: Payer: Self-pay | Admitting: Family Medicine

## 2011-03-23 ENCOUNTER — Other Ambulatory Visit: Payer: Self-pay | Admitting: Family Medicine

## 2011-03-23 DIAGNOSIS — I10 Essential (primary) hypertension: Secondary | ICD-10-CM

## 2011-03-23 MED ORDER — FUROSEMIDE 20 MG PO TABS: ORAL_TABLET | ORAL | Status: DC

## 2011-03-23 MED ORDER — METOPROLOL TARTRATE 100 MG PO TABS: 100.0000 mg | ORAL_TABLET | Freq: Two times a day (BID) | ORAL | Status: DC

## 2011-03-23 NOTE — Telephone Encounter (Signed)
Rf sent to [facility].

## 2011-03-25 ENCOUNTER — Telehealth: Payer: Self-pay

## 2011-03-25 NOTE — Telephone Encounter (Signed)
Call from patient and she stated the samples of BP meds that she was given (exforge) is causing her to feel bad, She is complaining of SOB and she stated she can not take this medication. She wants to know if she could get something else. Please advise    KP

## 2011-03-25 NOTE — Telephone Encounter (Signed)
According to chart she was fine on the lower dose--- change from 10/160  To 5/320  We should have samples---then keep f/u appoinmtment

## 2011-03-25 NOTE — Telephone Encounter (Signed)
msg left to call the office     KP 

## 2011-03-26 NOTE — Telephone Encounter (Signed)
msg left to call the office     KP 

## 2011-03-27 NOTE — Telephone Encounter (Signed)
Discussed with patient and samples have been left at check in. She agreed to keep apt.    KP

## 2011-04-08 ENCOUNTER — Encounter: Payer: Self-pay | Admitting: Family Medicine

## 2011-04-08 ENCOUNTER — Ambulatory Visit (INDEPENDENT_AMBULATORY_CARE_PROVIDER_SITE_OTHER): Payer: BC Managed Care – PPO | Admitting: Family Medicine

## 2011-04-08 VITALS — BP 142/90 | HR 79 | Temp 98.6°F | Wt 276.4 lb

## 2011-04-08 DIAGNOSIS — I10 Essential (primary) hypertension: Secondary | ICD-10-CM

## 2011-04-08 MED ORDER — SPIRONOLACTONE 50 MG PO TABS: 50.0000 mg | ORAL_TABLET | Freq: Every day | ORAL | Status: DC

## 2011-04-08 MED ORDER — AMLODIPINE BESYLATE-VALSARTAN 5-320 MG PO TABS: 1.0000 | ORAL_TABLET | Freq: Every day | ORAL | Status: DC

## 2011-04-08 NOTE — Patient Instructions (Signed)

## 2011-04-08 NOTE — Progress Notes (Signed)
  Subjective:    Patient here for follow-up of elevated blood pressure.  She is exercising and is adherent to a low-salt diet.  Blood pressure not checked  at home. Cardiac symptoms: none. Patient denies: chest pain, chest pressure/discomfort, claudication, dyspnea, exertional chest pressure/discomfort, fatigue, irregular heart beat, lower extremity edema, near-syncope, orthopnea, palpitations, paroxysmal nocturnal dyspnea, syncope and tachypnea. Cardiovascular risk factors: dyslipidemia, hypertension, obesity (BMI >= 30 kg/m2) and sedentary lifestyle. Use of agents associated with hypertension: none. History of target organ damage: none.  The following portions of the patient's history were reviewed and updated as appropriate: allergies, current medications, past family history, past medical history, past social history, past surgical history and problem list.  Review of Systems Pertinent items are noted in HPI.     Objective:    BP 142/90  Pulse 79  Temp(Src) 98.6 F (37 C) (Oral)  Wt 276 lb 6.4 oz (125.374 kg)  SpO2 96% General appearance: alert, cooperative, appears stated age and no distress Ears: normal TM's and external ear canals both ears Neck: no adenopathy, no carotid bruit, no JVD, supple, symmetrical, trachea midline and thyroid not enlarged, symmetric, no tenderness/mass/nodules Lungs: clear to auscultation bilaterally Heart: regular rate and rhythm, S1, S2 normal, no murmur, click, rub or gallop Extremities: extremities normal, atraumatic, no cyanosis or edema    Assessment:    Hypertension, stage 1 . Evidence of target organ damage: none.    Plan:    Medication: continue exforge, discontinue lasix and begin spironolactone. Dietary sodium restriction. Regular aerobic exercise. Check blood pressures 2-3 times weekly and record. Follow up: 3 weeks and as needed.

## 2011-05-29 ENCOUNTER — Other Ambulatory Visit: Payer: Self-pay | Admitting: Family Medicine

## 2011-06-01 ENCOUNTER — Other Ambulatory Visit: Payer: Self-pay | Admitting: Family Medicine

## 2011-06-12 ENCOUNTER — Other Ambulatory Visit: Payer: Self-pay | Admitting: Family Medicine

## 2011-06-12 MED ORDER — TRIAMCINOLONE ACETONIDE 0.5 % EX CREA: 1.0000 "application " | TOPICAL_CREAM | Freq: Three times a day (TID) | CUTANEOUS | Status: DC

## 2011-06-12 NOTE — Telephone Encounter (Signed)
Faxed.   KP 

## 2011-06-30 ENCOUNTER — Other Ambulatory Visit: Payer: Self-pay | Admitting: Family Medicine

## 2011-06-30 MED ORDER — TRAMADOL HCL 50 MG PO TABS: 50.0000 mg | ORAL_TABLET | Freq: Four times a day (QID) | ORAL | Status: DC | PRN

## 2011-06-30 NOTE — Telephone Encounter (Signed)
Last seen 04/08/11 and filled 11/04/10 # 60 with 1 refill... Please advise    KP

## 2011-09-15 ENCOUNTER — Other Ambulatory Visit: Payer: Self-pay | Admitting: Family Medicine

## 2011-09-15 DIAGNOSIS — F32A Depression, unspecified: Secondary | ICD-10-CM

## 2011-09-15 DIAGNOSIS — F329 Major depressive disorder, single episode, unspecified: Secondary | ICD-10-CM

## 2011-09-15 MED ORDER — BUPROPION HCL ER (XL) 150 MG PO TB24: 450.0000 mg | ORAL_TABLET | Freq: Every day | ORAL | Status: DC

## 2011-09-15 NOTE — Telephone Encounter (Signed)
Refill Bupropion HCL XL 150MG  Tablet Qty 90  Requested 5-refills  Take 3-tablets by mouth every day  Last filled 3.13.13

## 2011-11-04 ENCOUNTER — Other Ambulatory Visit: Payer: Self-pay | Admitting: Family Medicine

## 2011-11-04 NOTE — Telephone Encounter (Signed)
Refill done.  

## 2011-11-30 ENCOUNTER — Telehealth: Payer: Self-pay | Admitting: Family Medicine

## 2011-11-30 DIAGNOSIS — I10 Essential (primary) hypertension: Secondary | ICD-10-CM

## 2011-11-30 MED ORDER — AMLODIPINE BESYLATE-VALSARTAN 5-320 MG PO TABS: 1.0000 | ORAL_TABLET | Freq: Every day | ORAL | Status: DC

## 2011-11-30 NOTE — Telephone Encounter (Signed)
Refill: exforge 5-320mg  tab. Take 1 tablet by mouth every day. Qty 30. Last fill 10-20-11

## 2011-12-07 ENCOUNTER — Encounter: Payer: Self-pay | Admitting: Family Medicine

## 2011-12-07 ENCOUNTER — Encounter: Payer: Self-pay | Admitting: Gastroenterology

## 2011-12-07 ENCOUNTER — Ambulatory Visit (INDEPENDENT_AMBULATORY_CARE_PROVIDER_SITE_OTHER): Payer: BC Managed Care – PPO | Admitting: Family Medicine

## 2011-12-07 VITALS — BP 130/70 | HR 90 | Temp 98.5°F | Wt 272.8 lb

## 2011-12-07 DIAGNOSIS — Z0000 Encounter for general adult medical examination without abnormal findings: Secondary | ICD-10-CM

## 2011-12-07 DIAGNOSIS — E785 Hyperlipidemia, unspecified: Secondary | ICD-10-CM

## 2011-12-07 DIAGNOSIS — F329 Major depressive disorder, single episode, unspecified: Secondary | ICD-10-CM

## 2011-12-07 DIAGNOSIS — Z1231 Encounter for screening mammogram for malignant neoplasm of breast: Secondary | ICD-10-CM

## 2011-12-07 DIAGNOSIS — F32A Depression, unspecified: Secondary | ICD-10-CM

## 2011-12-07 DIAGNOSIS — M1990 Unspecified osteoarthritis, unspecified site: Secondary | ICD-10-CM

## 2011-12-07 DIAGNOSIS — I10 Essential (primary) hypertension: Secondary | ICD-10-CM

## 2011-12-07 DIAGNOSIS — Z1239 Encounter for other screening for malignant neoplasm of breast: Secondary | ICD-10-CM

## 2011-12-07 DIAGNOSIS — F3289 Other specified depressive episodes: Secondary | ICD-10-CM

## 2011-12-07 LAB — URINALYSIS, ROUTINE W REFLEX MICROSCOPIC
Specific Gravity, Urine: >=1.03 (ref 1.000–1.030)
Total Protein, Urine: NEGATIVE
Urine Glucose: NEGATIVE
Urobilinogen, UA: 0.2 (ref 0.0–1.0)
pH: 6 (ref 5.0–8.0)

## 2011-12-07 LAB — BASIC METABOLIC PANEL
CO2: 29 mEq/L (ref 19–32)
Calcium: 9.2 mg/dL (ref 8.4–10.5)
Chloride: 104 mEq/L (ref 96–112)
Glucose, Bld: 72 mg/dL (ref 70–99)
Potassium: 4 mEq/L (ref 3.5–5.1)
Sodium: 139 mEq/L (ref 135–145)

## 2011-12-07 LAB — LIPID PANEL
HDL: 37 mg/dL — ABNORMAL LOW (ref >39.00)
LDL Cholesterol: 117 mg/dL — ABNORMAL HIGH (ref 0–99)
Total CHOL/HDL Ratio: 5
Triglycerides: 157 mg/dL — ABNORMAL HIGH (ref 0.0–149.0)
VLDL: 31.4 mg/dL (ref 0.0–40.0)

## 2011-12-07 LAB — HEPATIC FUNCTION PANEL
Albumin: 3.7 g/dL (ref 3.5–5.2)
Total Bilirubin: 0.2 mg/dL — ABNORMAL LOW (ref 0.3–1.2)

## 2011-12-07 MED ORDER — PRAVASTATIN SODIUM 20 MG PO TABS: 20.0000 mg | ORAL_TABLET | Freq: Every evening | ORAL | Status: DC

## 2011-12-07 MED ORDER — BUPROPION HCL ER (XL) 150 MG PO TB24: ORAL_TABLET | ORAL | Status: DC

## 2011-12-07 MED ORDER — SPIRONOLACTONE 50 MG PO TABS: 50.0000 mg | ORAL_TABLET | Freq: Every day | ORAL | Status: DC

## 2011-12-07 MED ORDER — AMLODIPINE BESYLATE-VALSARTAN 5-320 MG PO TABS: 1.0000 | ORAL_TABLET | Freq: Every day | ORAL | Status: DC

## 2011-12-07 MED ORDER — METOPROLOL TARTRATE 100 MG PO TABS: 100.0000 mg | ORAL_TABLET | Freq: Two times a day (BID) | ORAL | Status: DC

## 2011-12-07 MED ORDER — TRAMADOL HCL 50 MG PO TABS: 50.0000 mg | ORAL_TABLET | Freq: Four times a day (QID) | ORAL | Status: DC | PRN

## 2011-12-07 NOTE — Patient Instructions (Addendum)

## 2011-12-07 NOTE — Addendum Note (Signed)
Addended by: Silvio Pate D on: 12/07/2011 09:28 AM   Modules accepted: Orders

## 2011-12-07 NOTE — Progress Notes (Signed)
  Subjective:    Patient here for follow-up of elevated blood pressure.  She is exercising and is adherent to a low-salt diet.  Blood pressure is well controlled at home. Cardiac symptoms: none. Patient denies: chest pain, chest pressure/discomfort, claudication, dyspnea, exertional chest pressure/discomfort, fatigue, irregular heart beat, lower extremity edema, near-syncope, orthopnea, palpitations, paroxysmal nocturnal dyspnea, syncope and tachypnea. Cardiovascular risk factors: dyslipidemia, hypertension and obesity (BMI >= 30 kg/m2). Use of agents associated with hypertension: none. History of target organ damage: none.  The following portions of the patient's history were reviewed and updated as appropriate: allergies, current medications, past family history, past medical history, past social history, past surgical history and problem list.  Review of Systems Pertinent items are noted in HPI.     Objective:    BP 130/70  Pulse 90  Temp 98.5 F (36.9 C) (Oral)  Wt 272 lb 12.8 oz (123.741 kg)  SpO2 99% General appearance: alert, cooperative, appears stated age and no distress Lungs: clear to auscultation bilaterally Heart: regular rate and rhythm, S1, S2 normal, no murmur, click, rub or gallop Extremities: extremities normal, atraumatic, no cyanosis or edema    Assessment:    Hypertension, normal blood pressure . Evidence of target organ damage: none.   hyperlipidemia  Plan:    Medication: no change. Dietary sodium restriction. Regular aerobic exercise. Follow up: 6 months and as needed.

## 2011-12-17 ENCOUNTER — Other Ambulatory Visit: Payer: Self-pay

## 2011-12-17 DIAGNOSIS — E785 Hyperlipidemia, unspecified: Secondary | ICD-10-CM

## 2011-12-17 MED ORDER — ATORVASTATIN CALCIUM 20 MG PO TABS: 20.0000 mg | ORAL_TABLET | Freq: Every day | ORAL | Status: DC

## 2011-12-29 ENCOUNTER — Ambulatory Visit (AMBULATORY_SURGERY_CENTER): Payer: BC Managed Care – PPO

## 2011-12-29 ENCOUNTER — Encounter: Payer: Self-pay | Admitting: Gastroenterology

## 2011-12-29 VITALS — Ht 69.0 in | Wt 276.6 lb

## 2011-12-29 DIAGNOSIS — Z1211 Encounter for screening for malignant neoplasm of colon: Secondary | ICD-10-CM

## 2011-12-29 MED ORDER — MOVIPREP 100 G PO SOLR: ORAL | Status: DC

## 2012-01-05 ENCOUNTER — Encounter: Payer: Self-pay | Admitting: Gastroenterology

## 2012-01-05 ENCOUNTER — Ambulatory Visit (AMBULATORY_SURGERY_CENTER): Payer: BC Managed Care – PPO | Admitting: Gastroenterology

## 2012-01-05 VITALS — BP 170/87 | HR 79 | Temp 99.2°F | Resp 17 | Ht 69.0 in | Wt 276.0 lb

## 2012-01-05 DIAGNOSIS — Z1211 Encounter for screening for malignant neoplasm of colon: Secondary | ICD-10-CM

## 2012-01-05 DIAGNOSIS — I82B19 Acute embolism and thrombosis of unspecified subclavian vein: Secondary | ICD-10-CM

## 2012-01-05 DIAGNOSIS — D126 Benign neoplasm of colon, unspecified: Secondary | ICD-10-CM

## 2012-01-05 MED ORDER — SODIUM CHLORIDE 0.9 % IV SOLN: 500.0000 mL | INTRAVENOUS | Status: DC

## 2012-01-05 NOTE — Patient Instructions (Addendum)

## 2012-01-05 NOTE — Op Note (Signed)
 [facility] [address] [place], [place]  [address]  COLONOSCOPY PROCEDURE REPORT  PATIENT:  Michaela, Martinez  MR#:  [id number] BIRTHDATE:  [birth year], 66 yrs. old  GENDER:  female ENDOSCOPIST:  Barbette Hair. Arlyce Dice, MD REF. BY:  Loreen Freud, DO PROCEDURE DATE:  01/05/2012 PROCEDURE:  Colonoscopy with polypectomy and submucosal injection, Colon with cold biopsy polypectomy, Colon w/ endoscopic clipping ASA CLASS:  Class II INDICATIONS:  Routine Risk Screening MEDICATIONS:   MAC sedation, administered by CRNA propofol 480mg IV  DESCRIPTION OF PROCEDURE:   After the risks benefits and alternatives of the procedure were thoroughly explained, informed consent was obtained.  Digital rectal exam was performed and revealed no abnormalities.   The LB PCF-H180AL [id number] endoscope was introduced through the anus and advanced to the cecum, which was identified by both the appendix and ileocecal valve, without limitations.  The quality of the prep was excellent, using MoviPrep.  The instrument was then slowly withdrawn as the colon was fully examined. <<PROCEDUREIMAGES>>  FINDINGS:  A sessile polyp was found in the cecum. It was 3 cm in size. submucosal injection Polyp was snared, then cauterized with monopolar cautery. Retrieval was successful. snare polyp endoscopic clip placement 14ccNS injected submucosally. There was minimal oozing from polypectomy site. 2 endoscopic clips were applied affecting complete closure of site (see image3, image4, image5, and image8).  Two polyps were found in the cecum (see image6). 2 2mm sessile polyps - removed with cold biopsy forceps  A sessile polyp was found in the descending colon. It was 3 mm in size. Polyp was snared without cautery. Retrieval was successful. snare polyp  Scattered diverticula were found in the descending colon (see image9).  This was otherwise a normal examination of the colon (see image10).   Retroflexed views in the rectum  revealed no abnormalities.    The time to cecum =  1) 2.25 minutes. The scope was then withdrawn in  1) 30.50  minutes from the cecum and the procedure completed. 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 Arlyce Dice in 1-2 weeks, after reviewing the final pathology, with followup recommendations.  ______________________________ Barbette Hair Arlyce Dice, MD  CC:  n. eSIGNED:   Barbette Hair. Mariamawit Depaoli at 01/05/2012 03:23 PM  [place], [phone]

## 2012-01-05 NOTE — Progress Notes (Signed)
Patient did not experience any of the following events: a burn prior to discharge; a fall within the facility; wrong site/side/patient/procedure/implant event; or a hospital transfer or hospital admission upon discharge from the facility. (G8907) Patient did not have preoperative order for IV antibiotic SSI prophylaxis. (G8918)  

## 2012-01-05 NOTE — Progress Notes (Signed)
The pt tolerated the colonoscopy very well. Maw   

## 2012-01-06 ENCOUNTER — Telehealth: Payer: Self-pay | Admitting: *Deleted

## 2012-01-06 NOTE — Telephone Encounter (Signed)
  Follow up Call-  Call back number 01/05/2012  Post procedure Call Back phone  # [phone]  Permission to leave phone message Yes     Patient questions:  Do you have a fever, pain , or abdominal swelling? no Pain Score  0 *  Have you tolerated food without any problems? yes  Have you been able to return to your normal activities? yes  Do you have any questions about your discharge instructions: Diet   no Medications  no Follow up visit  no  Do you have questions or concerns about your Care? no  Actions: * If pain score is 4 or above: No action needed, pain <4.

## 2012-01-12 ENCOUNTER — Encounter: Payer: Self-pay | Admitting: Gastroenterology

## 2012-05-04 ENCOUNTER — Other Ambulatory Visit: Payer: Self-pay | Admitting: Family Medicine

## 2012-07-19 ENCOUNTER — Other Ambulatory Visit: Payer: Self-pay | Admitting: Family Medicine

## 2012-08-11 ENCOUNTER — Encounter: Payer: Self-pay | Admitting: Lab

## 2012-08-12 ENCOUNTER — Encounter: Payer: BC Managed Care – PPO | Admitting: Family Medicine

## 2012-08-17 ENCOUNTER — Ambulatory Visit (INDEPENDENT_AMBULATORY_CARE_PROVIDER_SITE_OTHER): Payer: Medicare PPO | Admitting: Family Medicine

## 2012-08-17 ENCOUNTER — Encounter: Payer: Self-pay | Admitting: Family Medicine

## 2012-08-17 VITALS — BP 138/74 | HR 77 | Temp 98.9°F | Wt 279.2 lb

## 2012-08-17 DIAGNOSIS — M6740 Ganglion, unspecified site: Secondary | ICD-10-CM

## 2012-08-17 DIAGNOSIS — M67432 Ganglion, left wrist: Secondary | ICD-10-CM

## 2012-08-17 DIAGNOSIS — F411 Generalized anxiety disorder: Secondary | ICD-10-CM

## 2012-08-17 DIAGNOSIS — D492 Neoplasm of unspecified behavior of bone, soft tissue, and skin: Secondary | ICD-10-CM

## 2012-08-17 DIAGNOSIS — D229 Melanocytic nevi, unspecified: Secondary | ICD-10-CM

## 2012-08-17 MED ORDER — ALPRAZOLAM 0.5 MG PO TABS: ORAL_TABLET | ORAL | Status: DC

## 2012-08-17 NOTE — Patient Instructions (Signed)
Mole  Moles are usually harmless skin spots. Moles can be different colors, from light brown to black. People usually develop many of them over their lifetime. Moles increase in number during the first 3 decades of life. Moles can be anywhere on the body. They may be flat or raised and sometimes they contain hair. Although moles can change over time, they only rarely turn into skin cancer. The moles that do not turn into cancer (benign) are usually:   Small (the size of a pencil eraser or less).   One color.   Smooth.   Have a uniform border.   Round and oval and do not change much in appearance over time.  Malignant melanoma is a skin cancer that starts like a mole. Moles that were present at birth are more likely to turn into a melanoma later in life especially if they were very large (larger than 7 inches [20 cm] in diameter) at birth. Melanomas are different from moles. Melanomas:   Have Borders that are more ragged.   Have more than one color (red, white or blue) in addition to brown or black.   Are usually bigger.  Years of sun exposure increases the risk for getting melanoma. It is important to look at your moles regularly so that you can notice any changes that may occur within anyone of them that make it different from your other moles on your body. Know the A, B, C, D's of your moles  a mole is more worrisome for a melanoma if:  A: It is Asymmetrical (one half is different from the other half).  B: The Borders are jagged, etched or irregular.  C: The Color is uneven (shades of brown, tan and/or black).   D: The Diameter is greater than a quarter of an inch (6 mm).  SEEK MEDICAL CARE IF:   Your mole bleeds or becomes an open sore or does not heal.   Your mole grows rapidly or changes color.   Your mole becomes irregular and bumpy.   Your mole becomes painful, itchy, tender or sensitive.  Document Released: 07/02/2004 Document Revised: 08/17/2011 Document Reviewed: 05/11/2008  [employer] Patient  Information 2013 [employer].

## 2012-08-17 NOTE — Progress Notes (Signed)
  Subjective:    Patient ID: Michaela Martinez, female    DOB: [birth year], 67 y.o.   MRN: [id number]  HPI Pt here c/o lump on her L hand.  It has been there for a few weeks but is getting painful.   Review of Systems    as above Objective:   Physical Exam  BP 138/74  Pulse 77  Temp(Src) 98.9 F (37.2 C) (Oral)  Wt 279 lb 3.2 oz (126.644 kg)  BMI 41.21 kg/m2  SpO2 97% General appearance: alert, cooperative, appears stated age and no distress Skin: hard nodule L hand dorsal side over 2nd MCP      Assessment & Plan:

## 2012-08-19 DIAGNOSIS — D229 Melanocytic nevi, unspecified: Secondary | ICD-10-CM | POA: Insufficient documentation

## 2012-08-19 NOTE — Assessment & Plan Note (Addendum)
Suspect bcc  Refer to derm

## 2012-08-30 ENCOUNTER — Telehealth: Payer: Self-pay | Admitting: Family Medicine

## 2012-08-30 NOTE — Telephone Encounter (Signed)
Patient requesting samples of exforge 5-320 mg. Call home # when ready for pick up.

## 2012-08-30 NOTE — Telephone Encounter (Signed)
Patient aware samples at the front for pick-up

## 2012-10-05 ENCOUNTER — Encounter: Payer: Self-pay | Admitting: Lab

## 2012-10-06 ENCOUNTER — Encounter: Payer: Self-pay | Admitting: Family Medicine

## 2012-10-06 ENCOUNTER — Ambulatory Visit (INDEPENDENT_AMBULATORY_CARE_PROVIDER_SITE_OTHER): Payer: Medicare PPO | Admitting: Family Medicine

## 2012-10-06 VITALS — BP 142/82 | HR 73 | Temp 98.5°F | Ht 68.0 in | Wt 275.4 lb

## 2012-10-06 DIAGNOSIS — G4700 Insomnia, unspecified: Secondary | ICD-10-CM

## 2012-10-06 DIAGNOSIS — F32A Depression, unspecified: Secondary | ICD-10-CM

## 2012-10-06 DIAGNOSIS — R21 Rash and other nonspecific skin eruption: Secondary | ICD-10-CM

## 2012-10-06 DIAGNOSIS — M1990 Unspecified osteoarthritis, unspecified site: Secondary | ICD-10-CM

## 2012-10-06 DIAGNOSIS — G4733 Obstructive sleep apnea (adult) (pediatric): Secondary | ICD-10-CM

## 2012-10-06 DIAGNOSIS — I10 Essential (primary) hypertension: Secondary | ICD-10-CM

## 2012-10-06 DIAGNOSIS — Z23 Encounter for immunization: Secondary | ICD-10-CM

## 2012-10-06 DIAGNOSIS — R9431 Abnormal electrocardiogram [ECG] [EKG]: Secondary | ICD-10-CM

## 2012-10-06 DIAGNOSIS — F329 Major depressive disorder, single episode, unspecified: Secondary | ICD-10-CM

## 2012-10-06 DIAGNOSIS — F3289 Other specified depressive episodes: Secondary | ICD-10-CM

## 2012-10-06 DIAGNOSIS — F411 Generalized anxiety disorder: Secondary | ICD-10-CM

## 2012-10-06 DIAGNOSIS — E2839 Other primary ovarian failure: Secondary | ICD-10-CM

## 2012-10-06 DIAGNOSIS — E785 Hyperlipidemia, unspecified: Secondary | ICD-10-CM

## 2012-10-06 DIAGNOSIS — Z0000 Encounter for general adult medical examination without abnormal findings: Secondary | ICD-10-CM

## 2012-10-06 DIAGNOSIS — Z1239 Encounter for other screening for malignant neoplasm of breast: Secondary | ICD-10-CM

## 2012-10-06 LAB — BASIC METABOLIC PANEL
BUN: 13 mg/dL (ref 6–23)
CO2: 27 mEq/L (ref 19–32)
Calcium: 9.3 mg/dL (ref 8.4–10.5)
Creatinine, Ser: 0.8 mg/dL (ref 0.4–1.2)
Glucose, Bld: 83 mg/dL (ref 70–99)

## 2012-10-06 LAB — HEPATIC FUNCTION PANEL
Albumin: 3.9 g/dL (ref 3.5–5.2)
Alkaline Phosphatase: 67 U/L (ref 39–117)
Bilirubin, Direct: 0 mg/dL (ref 0.0–0.3)

## 2012-10-06 LAB — LIPID PANEL
LDL Cholesterol: 108 mg/dL — ABNORMAL HIGH (ref 0–99)
Total CHOL/HDL Ratio: 5

## 2012-10-06 LAB — CBC WITH DIFFERENTIAL/PLATELET
Basophils Absolute: 0 10*3/uL (ref 0.0–0.1)
Eosinophils Absolute: 0.1 10*3/uL (ref 0.0–0.7)
Lymphocytes Relative: 24.6 % (ref 12.0–46.0)
MCHC: 32.8 g/dL (ref 30.0–36.0)
Neutro Abs: 6.2 10*3/uL (ref 1.4–7.7)
Neutrophils Relative %: 67.9 % (ref 43.0–77.0)
Platelets: 225 10*3/uL (ref 150.0–400.0)
RDW: 14.7 % — ABNORMAL HIGH (ref 11.5–14.6)

## 2012-10-06 LAB — POCT URINALYSIS DIPSTICK
Glucose, UA: NEGATIVE
Nitrite, UA: NEGATIVE
Urobilinogen, UA: 0.2

## 2012-10-06 LAB — MICROALBUMIN / CREATININE URINE RATIO
Creatinine,U: 226.4 mg/dL
Microalb, Ur: 0.6 mg/dL (ref 0.0–1.9)

## 2012-10-06 MED ORDER — TRIAMCINOLONE ACETONIDE 0.5 % EX CREA: 1.0000 "application " | TOPICAL_CREAM | Freq: Three times a day (TID) | CUTANEOUS | Status: DC

## 2012-10-06 MED ORDER — TRAMADOL HCL 50 MG PO TABS: 50.0000 mg | ORAL_TABLET | Freq: Four times a day (QID) | ORAL | Status: DC | PRN

## 2012-10-06 MED ORDER — ALPRAZOLAM 0.5 MG PO TABS: ORAL_TABLET | ORAL | Status: DC

## 2012-10-06 MED ORDER — SERTRALINE HCL 50 MG PO TABS: 50.0000 mg | ORAL_TABLET | Freq: Every day | ORAL | Status: DC

## 2012-10-06 MED ORDER — VALSARTAN 320 MG PO TABS: 320.0000 mg | ORAL_TABLET | Freq: Every day | ORAL | Status: DC

## 2012-10-06 MED ORDER — BUPROPION HCL ER (XL) 150 MG PO TB24: ORAL_TABLET | ORAL | Status: DC

## 2012-10-06 MED ORDER — METOPROLOL TARTRATE 100 MG PO TABS: 100.0000 mg | ORAL_TABLET | Freq: Two times a day (BID) | ORAL | Status: DC

## 2012-10-06 MED ORDER — ZOSTER VACCINE LIVE 19400 UNT/0.65ML ~~LOC~~ SOLR: 0.6500 mL | Freq: Once | SUBCUTANEOUS | Status: DC

## 2012-10-06 MED ORDER — AMLODIPINE BESYLATE 2.5 MG PO TABS: 2.5000 mg | ORAL_TABLET | Freq: Every day | ORAL | Status: DC

## 2012-10-06 MED ORDER — SPIRONOLACTONE 50 MG PO TABS: 50.0000 mg | ORAL_TABLET | Freq: Every day | ORAL | Status: DC

## 2012-10-06 NOTE — Patient Instructions (Addendum)
Preventive Care for Adults, Female A healthy lifestyle and preventive care can promote health and wellness. Preventive health guidelines for women include the following key practices.  A routine yearly physical is a good way to check with your caregiver about your health and preventive screening. It is a chance to share any concerns and updates on your health, and to receive a thorough exam.  Visit your dentist for a routine exam and preventive care every 6 months. Brush your teeth twice a day and floss once a day. Good oral hygiene prevents tooth decay and gum disease.  The frequency of eye exams is based on your age, health, family medical history, use of contact lenses, and other factors. Follow your caregiver's recommendations for frequency of eye exams.  Eat a healthy diet. Foods like vegetables, fruits, whole grains, low-fat dairy products, and lean protein foods contain the nutrients you need without too many calories. Decrease your intake of foods high in solid fats, added sugars, and salt. Eat the right amount of calories for you.Get information about a proper diet from your caregiver, if necessary.  Regular physical exercise is one of the most important things you can do for your health. Most adults should get at least 150 minutes of moderate-intensity exercise (any activity that increases your heart rate and causes you to sweat) each week. In addition, most adults need muscle-strengthening exercises on 2 or more days a week.  Maintain a healthy weight. The body mass index (BMI) is a screening tool to identify possible weight problems. It provides an estimate of body fat based on height and weight. Your caregiver can help determine your BMI, and can help you achieve or maintain a healthy weight.For adults 20 years and older:  A BMI below 18.5 is considered underweight.  A BMI of 18.5 to 24.9 is normal.  A BMI of 25 to 29.9 is considered overweight.  A BMI of 30 and above is  considered obese.  Maintain normal blood lipids and cholesterol levels by exercising and minimizing your intake of saturated fat. Eat a balanced diet with plenty of fruit and vegetables. Blood tests for lipids and cholesterol should begin at age 20 and be repeated every 5 years. If your lipid or cholesterol levels are high, you are over 50, or you are at high risk for heart disease, you may need your cholesterol levels checked more frequently.Ongoing high lipid and cholesterol levels should be treated with medicines if diet and exercise are not effective.  If you smoke, find out from your caregiver how to quit. If you do not use tobacco, do not start.  If you are pregnant, do not drink alcohol. If you are breastfeeding, be very cautious about drinking alcohol. If you are not pregnant and choose to drink alcohol, do not exceed 1 drink per day. One drink is considered to be 12 ounces (355 mL) of beer, 5 ounces (148 mL) of wine, or 1.5 ounces (44 mL) of liquor.  Avoid use of street drugs. Do not share needles with anyone. Ask for help if you need support or instructions about stopping the use of drugs.  High blood pressure causes heart disease and increases the risk of stroke. Your blood pressure should be checked at least every 1 to 2 years. Ongoing high blood pressure should be treated with medicines if weight loss and exercise are not effective.  If you are 55 to 65 years old, ask your caregiver if you should take aspirin to prevent strokes.  Diabetes   screening involves taking a blood sample to check your fasting blood sugar level. This should be done once every 3 years, after age 45, if you are within normal weight and without risk factors for diabetes. Testing should be considered at a younger age or be carried out more frequently if you are overweight and have at least 1 risk factor for diabetes.  Breast cancer screening is essential preventive care for women. You should practice "breast  self-awareness." This means understanding the normal appearance and feel of your breasts and may include breast self-examination. Any changes detected, no matter how small, should be reported to a caregiver. Women in their 20s and 30s should have a clinical breast exam (CBE) by a caregiver as part of a regular health exam every 1 to 3 years. After age 40, women should have a CBE every year. Starting at age 40, women should consider having a mammography (breast X-ray test) every year. Women who have a family history of breast cancer should talk to their caregiver about genetic screening. Women at a high risk of breast cancer should talk to their caregivers about having magnetic resonance imaging (MRI) and a mammography every year.  The Pap test is a screening test for cervical cancer. A Pap test can show cell changes on the cervix that might become cervical cancer if left untreated. A Pap test is a procedure in which cells are obtained and examined from the lower end of the uterus (cervix).  Women should have a Pap test starting at age 21.  Between ages 21 and 29, Pap tests should be repeated every 2 years.  Beginning at age 30, you should have a Pap test every 3 years as long as the past 3 Pap tests have been normal.  Some women have medical problems that increase the chance of getting cervical cancer. Talk to your caregiver about these problems. It is especially important to talk to your caregiver if a new problem develops soon after your last Pap test. In these cases, your caregiver may recommend more frequent screening and Pap tests.  The above recommendations are the same for women who have or have not gotten the vaccine for human papillomavirus (HPV).  If you had a hysterectomy for a problem that was not cancer or a condition that could lead to cancer, then you no longer need Pap tests. Even if you no longer need a Pap test, a regular exam is a good idea to make sure no other problems are  starting.  If you are between ages 65 and 70, and you have had normal Pap tests going back 10 years, you no longer need Pap tests. Even if you no longer need a Pap test, a regular exam is a good idea to make sure no other problems are starting.  If you have had past treatment for cervical cancer or a condition that could lead to cancer, you need Pap tests and screening for cancer for at least 20 years after your treatment.  If Pap tests have been discontinued, risk factors (such as a new sexual partner) need to be reassessed to determine if screening should be resumed.  The HPV test is an additional test that may be used for cervical cancer screening. The HPV test looks for the virus that can cause the cell changes on the cervix. The cells collected during the Pap test can be tested for HPV. The HPV test could be used to screen women aged 30 years and older, and should   be used in women of any age who have unclear Pap test results. After the age of 30, women should have HPV testing at the same frequency as a Pap test.  Colorectal cancer can be detected and often prevented. Most routine colorectal cancer screening begins at the age of 50 and continues through age 75. However, your caregiver may recommend screening at an earlier age if you have risk factors for colon cancer. On a yearly basis, your caregiver may provide home test kits to check for hidden blood in the stool. Use of a small camera at the end of a tube, to directly examine the colon (sigmoidoscopy or colonoscopy), can detect the earliest forms of colorectal cancer. Talk to your caregiver about this at age 50, when routine screening begins. Direct examination of the colon should be repeated every 5 to 10 years through age 75, unless early forms of pre-cancerous polyps or small growths are found.  Hepatitis C blood testing is recommended for all people born from 1945 through 1965 and any individual with known risks for hepatitis C.  Practice  safe sex. Use condoms and avoid high-risk sexual practices to reduce the spread of sexually transmitted infections (STIs). STIs include gonorrhea, chlamydia, syphilis, trichomonas, herpes, HPV, and human immunodeficiency virus (HIV). Herpes, HIV, and HPV are viral illnesses that have no cure. They can result in disability, cancer, and death. Sexually active women aged 25 and younger should be checked for chlamydia. Older women with new or multiple partners should also be tested for chlamydia. Testing for other STIs is recommended if you are sexually active and at increased risk.  Osteoporosis is a disease in which the bones lose minerals and strength with aging. This can result in serious bone fractures. The risk of osteoporosis can be identified using a bone density scan. Women ages 65 and over and women at risk for fractures or osteoporosis should discuss screening with their caregivers. Ask your caregiver whether you should take a calcium supplement or vitamin D to reduce the rate of osteoporosis.  Menopause can be associated with physical symptoms and risks. Hormone replacement therapy is available to decrease symptoms and risks. You should talk to your caregiver about whether hormone replacement therapy is right for you.  Use sunscreen with sun protection factor (SPF) of 30 or more. Apply sunscreen liberally and repeatedly throughout the day. You should seek shade when your shadow is shorter than you. Protect yourself by wearing long sleeves, pants, a wide-brimmed hat, and sunglasses year round, whenever you are outdoors.  Once a month, do a whole body skin exam, using a mirror to look at the skin on your back. Notify your caregiver of new moles, moles that have irregular borders, moles that are larger than a pencil eraser, or moles that have changed in shape or color.  Stay current with required immunizations.  Influenza. You need a dose every fall (or winter). The composition of the flu vaccine  changes each year, so being vaccinated once is not enough.  Pneumococcal polysaccharide. You need 1 to 2 doses if you smoke cigarettes or if you have certain chronic medical conditions. You need 1 dose at age 65 (or older) if you have never been vaccinated.  Tetanus, diphtheria, pertussis (Tdap, Td). Get 1 dose of Tdap vaccine if you are younger than age 65, are over 65 and have contact with an infant, are a healthcare worker, are pregnant, or simply want to be protected from whooping cough. After that, you need a Td   booster dose every 10 years. Consult your caregiver if you have not had at least 3 tetanus and diphtheria-containing shots sometime in your life or have a deep or dirty wound.  HPV. You need this vaccine if you are a woman age 26 or younger. The vaccine is given in 3 doses over 6 months.  Measles, mumps, rubella (MMR). You need at least 1 dose of MMR if you were born in 1957 or later. You may also need a second dose.  Meningococcal. If you are age 19 to 21 and a first-year college student living in a residence hall, or have one of several medical conditions, you need to get vaccinated against meningococcal disease. You may also need additional booster doses.  Zoster (shingles). If you are age 60 or older, you should get this vaccine.  Varicella (chickenpox). If you have never had chickenpox or you were vaccinated but received only 1 dose, talk to your caregiver to find out if you need this vaccine.  Hepatitis A. You need this vaccine if you have a specific risk factor for hepatitis A virus infection or you simply wish to be protected from this disease. The vaccine is usually given as 2 doses, 6 to 18 months apart.  Hepatitis B. You need this vaccine if you have a specific risk factor for hepatitis B virus infection or you simply wish to be protected from this disease. The vaccine is given in 3 doses, usually over 6 months. Preventive Services / Frequency Ages 19 to 39  Blood  pressure check.** / Every 1 to 2 years.  Lipid and cholesterol check.** / Every 5 years beginning at age 20.  Clinical breast exam.** / Every 3 years for women in their 20s and 30s.  Pap test.** / Every 2 years from ages 21 through 29. Every 3 years starting at age 30 through age 65 or 70 with a history of 3 consecutive normal Pap tests.  HPV screening.** / Every 3 years from ages 30 through ages 65 to 70 with a history of 3 consecutive normal Pap tests.  Hepatitis C blood test.** / For any individual with known risks for hepatitis C.  Skin self-exam. / Monthly.  Influenza immunization.** / Every year.  Pneumococcal polysaccharide immunization.** / 1 to 2 doses if you smoke cigarettes or if you have certain chronic medical conditions.  Tetanus, diphtheria, pertussis (Tdap, Td) immunization. / A one-time dose of Tdap vaccine. After that, you need a Td booster dose every 10 years.  HPV immunization. / 3 doses over 6 months, if you are 26 and younger.  Measles, mumps, rubella (MMR) immunization. / You need at least 1 dose of MMR if you were born in 1957 or later. You may also need a second dose.  Meningococcal immunization. / 1 dose if you are age 19 to 21 and a first-year college student living in a residence hall, or have one of several medical conditions, you need to get vaccinated against meningococcal disease. You may also need additional booster doses.  Varicella immunization.** / Consult your caregiver.  Hepatitis A immunization.** / Consult your caregiver. 2 doses, 6 to 18 months apart.  Hepatitis B immunization.** / Consult your caregiver. 3 doses usually over 6 months. Ages 40 to 64  Blood pressure check.** / Every 1 to 2 years.  Lipid and cholesterol check.** / Every 5 years beginning at age 20.  Clinical breast exam.** / Every year after age 40.  Mammogram.** / Every year beginning at age 40   and continuing for as long as you are in good health. Consult with your  caregiver.  Pap test.** / Every 3 years starting at age 30 through age 65 or 70 with a history of 3 consecutive normal Pap tests.  HPV screening.** / Every 3 years from ages 30 through ages 65 to 70 with a history of 3 consecutive normal Pap tests.  Fecal occult blood test (FOBT) of stool. / Every year beginning at age 50 and continuing until age 75. You may not need to do this test if you get a colonoscopy every 10 years.  Flexible sigmoidoscopy or colonoscopy.** / Every 5 years for a flexible sigmoidoscopy or every 10 years for a colonoscopy beginning at age 50 and continuing until age 75.  Hepatitis C blood test.** / For all people born from 1945 through 1965 and any individual with known risks for hepatitis C.  Skin self-exam. / Monthly.  Influenza immunization.** / Every year.  Pneumococcal polysaccharide immunization.** / 1 to 2 doses if you smoke cigarettes or if you have certain chronic medical conditions.  Tetanus, diphtheria, pertussis (Tdap, Td) immunization.** / A one-time dose of Tdap vaccine. After that, you need a Td booster dose every 10 years.  Measles, mumps, rubella (MMR) immunization. / You need at least 1 dose of MMR if you were born in 1957 or later. You may also need a second dose.  Varicella immunization.** / Consult your caregiver.  Meningococcal immunization.** / Consult your caregiver.  Hepatitis A immunization.** / Consult your caregiver. 2 doses, 6 to 18 months apart.  Hepatitis B immunization.** / Consult your caregiver. 3 doses, usually over 6 months. Ages 65 and over  Blood pressure check.** / Every 1 to 2 years.  Lipid and cholesterol check.** / Every 5 years beginning at age 20.  Clinical breast exam.** / Every year after age 40.  Mammogram.** / Every year beginning at age 40 and continuing for as long as you are in good health. Consult with your caregiver.  Pap test.** / Every 3 years starting at age 30 through age 65 or 70 with a 3  consecutive normal Pap tests. Testing can be stopped between 65 and 70 with 3 consecutive normal Pap tests and no abnormal Pap or HPV tests in the past 10 years.  HPV screening.** / Every 3 years from ages 30 through ages 65 or 70 with a history of 3 consecutive normal Pap tests. Testing can be stopped between 65 and 70 with 3 consecutive normal Pap tests and no abnormal Pap or HPV tests in the past 10 years.  Fecal occult blood test (FOBT) of stool. / Every year beginning at age 50 and continuing until age 75. You may not need to do this test if you get a colonoscopy every 10 years.  Flexible sigmoidoscopy or colonoscopy.** / Every 5 years for a flexible sigmoidoscopy or every 10 years for a colonoscopy beginning at age 50 and continuing until age 75.  Hepatitis C blood test.** / For all people born from 1945 through 1965 and any individual with known risks for hepatitis C.  Osteoporosis screening.** / A one-time screening for women ages 65 and over and women at risk for fractures or osteoporosis.  Skin self-exam. / Monthly.  Influenza immunization.** / Every year.  Pneumococcal polysaccharide immunization.** / 1 dose at age 65 (or older) if you have never been vaccinated.  Tetanus, diphtheria, pertussis (Tdap, Td) immunization. / A one-time dose of Tdap vaccine if you are over   65 and have contact with an infant, are a healthcare worker, or simply want to be protected from whooping cough. After that, you need a Td booster dose every 10 years.  Varicella immunization.** / Consult your caregiver.  Meningococcal immunization.** / Consult your caregiver.  Hepatitis A immunization.** / Consult your caregiver. 2 doses, 6 to 18 months apart.  Hepatitis B immunization.** / Check with your caregiver. 3 doses, usually over 6 months. ** Family history and personal history of risk and conditions may change your caregiver's recommendations. Document Released: 07/21/2001 Document Revised: 08/17/2011  Document Reviewed: 10/20/2010 [employer] Patient Information 2013 [employer].  

## 2012-10-06 NOTE — Assessment & Plan Note (Signed)
Refer to pulm

## 2012-10-06 NOTE — Assessment & Plan Note (Signed)
Refer to pulm Pt with hx [facility]

## 2012-10-06 NOTE — Assessment & Plan Note (Signed)
con't meds  Check labs 

## 2012-10-06 NOTE — Assessment & Plan Note (Signed)
Stable , con't meds  

## 2012-10-06 NOTE — Assessment & Plan Note (Signed)
con't wellbutrin and zoloft

## 2012-10-06 NOTE — Progress Notes (Signed)
Subjective:    Michaela Martinez is a 67 y.o. female who presents for a welcome to Medicare exam.   Cardiac risk factors: dyslipidemia, hypertension, obesity (BMI >= 30 kg/m2) and sedentary lifestyle.  Activities of Daily Living  In your present state of health, do you have any difficulty performing the following activities?:  Preparing food and eating?: No Bathing yourself: No Getting dressed: No Using the toilet:No Moving around from place to place: No In the past year have you fallen or had a near fall?:No  Current exercise habits: walking   Dietary issues discussed: yes   Depression Screen (Note: if answer to either of the following is "Yes", then a more complete depression screening is indicated)  Q1: Over the past two weeks, have you felt down, depressed or hopeless?yes Q2: Over the past two weeks, have you felt little interest or pleasure in doing things? no   The following portions of the patient's history were reviewed and updated as appropriate: allergies, current medications, past family history, past medical history, past social history, past surgical history and problem list. Review of Systems  Review of Systems  Constitutional: Negative for activity change, appetite change and fatigue.  HENT: Negative for hearing loss, congestion, tinnitus and ear discharge.   Eyes: Negative for visual disturbance (see optho q1y -- vision corrected).  Respiratory: Negative for cough, chest tightness and shortness of breath.   Cardiovascular: Negative for chest pain, palpitations and leg swelling.  Gastrointestinal: Negative for abdominal pain, diarrhea, constipation and abdominal distention.  Genitourinary: Negative for urgency, frequency, decreased urine volume and difficulty urinating.  Musculoskeletal: Negative for back pain, arthralgias and gait problem.  Skin: Negative for color change, pallor and rash.  Neurological: Negative for dizziness, light-headedness, numbness and  headaches.  Hematological: Negative for adenopathy. Does not bruise/bleed easily.  Psychiatric/Behavioral: Negative for suicidal ideas, confusion, sleep disturbance, self-injury, dysphoric mood, decreased concentration and agitation.  Pt is able to read and write and can do all ADLs No risk for falling No abuse/ violence in home      Objective:     Vision by Snellen chart: opth Blood pressure 142/82, pulse 73, temperature 98.5 F (36.9 C), temperature source Oral, height 5\' 8"  (1.727 m), weight 275 lb 6.4 oz (124.921 kg), SpO2 99.00%. Body mass index is 41.88 kg/(m^2). BP 142/82  Pulse 73  Temp(Src) 98.5 F (36.9 C) (Oral)  Ht 5\' 8"  (1.727 m)  Wt 275 lb 6.4 oz (124.921 kg)  BMI 41.88 kg/m2  SpO2 99% General appearance: alert, cooperative, appears stated age and no distress Head: Normocephalic, without obvious abnormality, atraumatic Eyes: conjunctivae/corneas clear. PERRL, EOM's intact. Fundi benign. Ears: normal TM's and external ear canals both ears Nose: Nares normal. Septum midline. Mucosa normal. No drainage or sinus tenderness. Throat: lips, mucosa, and tongue normal; teeth and gums normal Neck: no adenopathy, no carotid bruit, no JVD, supple, symmetrical, trachea midline and thyroid not enlarged, symmetric, no tenderness/mass/nodules Back: symmetric, no curvature. ROM normal. No CVA tenderness. Lungs: clear to auscultation bilaterally Breasts: normal appearance, no masses or tenderness Heart: regular rate and rhythm, S1, S2 normal, no murmur, click, rub or gallop Abdomen: soft, non-tender; bowel sounds normal; no masses,  no organomegaly Pelvic: not indicated; post-menopausal, no abnormal Pap smears in past Extremities: extremities normal, atraumatic, no cyanosis or edema Pulses: 2+ and symmetric Skin: Skin color, texture, turgor normal. No rashes or lesions Lymph nodes: Cervical, supraclavicular, and axillary nodes normal. Neurologic: Alert and oriented X 3, normal  strength and tone.  Normal symmetric reflexes. Normal coordination and gait Psych-- no depression, no anxiety      Assessment:     cpe      Plan:     During the course of the visit the patient was educated and counseled about appropriate screening and preventive services including:   Pneumococcal vaccine   Td vaccine  Screening electrocardiogram  Screening mammography  Bone densitometry screening  Colorectal cancer screening  Glaucoma screening  Advanced directives: has an advanced directive - a copy HAS NOT been provided.  Patient Instructions (the written plan) was given to the patient.

## 2012-10-08 ENCOUNTER — Other Ambulatory Visit: Payer: Self-pay | Admitting: Family Medicine

## 2012-10-08 DIAGNOSIS — E785 Hyperlipidemia, unspecified: Secondary | ICD-10-CM

## 2012-10-08 MED ORDER — EZETIMIBE 10 MG PO TABS: 10.0000 mg | ORAL_TABLET | Freq: Every day | ORAL | Status: DC

## 2012-10-24 ENCOUNTER — Encounter: Payer: Self-pay | Admitting: Family Medicine

## 2012-11-02 ENCOUNTER — Ambulatory Visit (INDEPENDENT_AMBULATORY_CARE_PROVIDER_SITE_OTHER): Payer: Medicare PPO | Admitting: Cardiovascular Disease

## 2012-11-02 ENCOUNTER — Encounter: Payer: Self-pay | Admitting: Cardiovascular Disease

## 2012-11-02 VITALS — BP 160/80 | HR 68 | Ht 69.5 in | Wt 273.0 lb

## 2012-11-02 DIAGNOSIS — E785 Hyperlipidemia, unspecified: Secondary | ICD-10-CM

## 2012-11-02 DIAGNOSIS — I10 Essential (primary) hypertension: Secondary | ICD-10-CM

## 2012-11-02 DIAGNOSIS — R9431 Abnormal electrocardiogram [ECG] [EKG]: Secondary | ICD-10-CM

## 2012-11-02 DIAGNOSIS — R609 Edema, unspecified: Secondary | ICD-10-CM

## 2012-11-02 NOTE — Assessment & Plan Note (Signed)
Dependant from obesity and high salt diet Discussed diuretic as needed per primary

## 2012-11-02 NOTE — Assessment & Plan Note (Signed)
Likely represents LVH  Echo to r/o HOCM or apical hypertroph No need for ETT as she is not having chest pain or cardiac symptoms

## 2012-11-02 NOTE — Progress Notes (Signed)
Patient ID: Michaela Martinez, female   DOB: 07-Oct-1945, 67 y.o.   MRN: [id number] 67 yo obese female with sleep apnea referred for HTN and edema.  She is obese with poor diet and sedentary other than walking.  Rx for elevated BP for over 20 years.  Mild exertional dyspnea Not compliant with CPAP.  Dependant edema.  No history of DVT or postphlebitic syndrome.  Denies SSCP palpitations or syncope.  No previous history of heart issues. Compliant with BP meds  ROS: Denies fever, malais, weight loss, blurry vision, decreased visual acuity, cough, sputum, SOB, hemoptysis, pleuritic pain, palpitaitons, heartburn, abdominal pain, melena, lower extremity edema, claudication, or rash.  All other systems reviewed and negative   General: Affect appropriate Healthy:  appears stated age HEENT: normal Neck supple with no adenopathy JVP normal no bruits no thyromegaly Lungs clear with no wheezing and good diaphragmatic motion Heart:  S1/S2 no murmur,rub, gallop or click PMI normal Abdomen: benighn, BS positve, no tenderness, no AAA no bruit.  No HSM or HJR Distal pulses intact with no bruits No edema Neuro non-focal Skin warm and dry No muscular weakness  Medications Current Outpatient Prescriptions  Medication Sig Dispense Refill  . ALPRAZolam (XANAX) 0.5 MG tablet Take 1 30 min prior to procedure and tid prn after that  10 tablet  0  . amLODipine (NORVASC) 2.5 MG tablet Take 1 tablet (2.5 mg total) by mouth daily.  90 tablet  3  . atorvastatin (LIPITOR) 20 MG tablet Take 1 tablet (20 mg total) by mouth daily.  30 tablet  2  . buPROPion (WELLBUTRIN XL) 150 MG 24 hr tablet 3 tabs by mouth daily---  90 tablet  0  . Cholecalciferol (VITAMIN D) 2000 UNITS CAPS Take 1 capsule by mouth daily.        Marland Kitchen ezetimibe (ZETIA) 10 MG tablet Take 1 tablet (10 mg total) by mouth daily.  90 tablet  3  . metoprolol (LOPRESSOR) 100 MG tablet Take 1 tablet (100 mg total) by mouth 2 (two) times daily.  180 tablet  3  .  sertraline (ZOLOFT) 50 MG tablet Take 1 tablet (50 mg total) by mouth daily.  30 tablet  3  . spironolactone (ALDACTONE) 50 MG tablet Take 1 tablet (50 mg total) by mouth daily.  90 tablet  3  . traMADol (ULTRAM) 50 MG tablet Take 1 tablet (50 mg total) by mouth every 6 (six) hours as needed for pain.  60 tablet  2  . triamcinolone cream (KENALOG) 0.5 % Apply 1 application topically 3 (three) times daily.  30 g  1  . valsartan (DIOVAN) 320 MG tablet Take 1 tablet (320 mg total) by mouth daily.  90 tablet  3  . zoster vaccine live, PF, (ZOSTAVAX) 40981 UNT/0.65ML injection Inject 19,400 Units into the skin once.  1 vial  0   No current facility-administered medications for this visit.    Allergies Norvasc  Family History: Family History  Problem Relation Age of Onset  . Diabetes    . Hypertension    . Liver disease    . Pancreatic cancer    . Alcohol abuse    . Depression    . Asthma Son   . Pancreatic cancer Mother   . Breast cancer Maternal Grandmother   . Cirrhosis Brother     Social History: History   Social History  . Marital Status: Married    Spouse Name: N/A    Number of Children: N/A  .  Years of Education: N/A   Occupational History  . Not on file.   Social History Main Topics  . Smoking status: Never Smoker   . Smokeless tobacco: Never Used  . Alcohol Use: No  . Drug Use: No  . Sexually Active: Not on file   Other Topics Concern  . Not on file   Social History Narrative  . No narrative on file    Electrocardiogram:  SR LVH with lateral T wave changes  Assessment and Plan

## 2012-11-02 NOTE — Patient Instructions (Addendum)
**Note De-identified Soliana Kitko Obfuscation** Your physician has requested that you have an echocardiogram. Echocardiography is a painless test that uses sound waves to create images of your heart. It provides your doctor with information about the size and shape of your heart and how well your heart's chambers and valves are working. This procedure takes approximately one hour. There are no restrictions for this procedure.  Your physician recommends that you schedule a follow-up appointment in: as needed  

## 2012-11-02 NOTE — Assessment & Plan Note (Signed)
Well controlled.  Continue current medications and low sodium Dash type diet.    

## 2012-11-02 NOTE — Assessment & Plan Note (Signed)
Cholesterol is at goal.  Continue current dose of statin and diet Rx.  No myalgias or side effects.  F/U  LFT's in 6 months. Lab Results  Component Value Date   LDLCALC 108* 10/06/2012

## 2012-11-10 ENCOUNTER — Telehealth: Payer: Self-pay | Admitting: Family Medicine

## 2012-11-10 NOTE — Telephone Encounter (Signed)
Pt left VM that Rx for exforge was too expensive so Dr Laury Axon separated the 2 meds and they are still expensive separately. Pt would like for Dr Laury Axon to Rx her another cheaper BP med.   Pt advise to obtain a copy of her Formulary as well so that we can determine cheaper alternative based on her current plan. Pt ok will look in to obtaining this info.

## 2012-11-10 NOTE — Telephone Encounter (Signed)
Spoke with Dr Laury Axon who states that she had previously told Pt that med may still be expensive even with the separation. Per Dr Laury Axon Pt will need to get [place] a copy of her formulary so that we can find her a cheaper alternative on her plan. Discuss with patient will obtain formulary for [place].

## 2012-11-23 NOTE — Telephone Encounter (Addendum)
Formulary received and Dr.Lowne advises Losartan 100 mg 1 po qd and #30 with 2 and Norvasc 5 mg 1 po qd #30. Patient has had a reaction to the Norvasc in the past, please advise    KP

## 2012-11-23 NOTE — Addendum Note (Signed)
Addended by: Arnette Norris on: 11/23/2012 05:06 PM   Modules accepted: Orders, Medications

## 2012-11-23 NOTE — Telephone Encounter (Signed)
Just do losartan for now---ov in 2-3 weeks

## 2012-11-24 MED ORDER — LOSARTAN POTASSIUM 100 MG PO TABS: 100.0000 mg | ORAL_TABLET | Freq: Every day | ORAL | Status: DC

## 2012-11-24 NOTE — Telephone Encounter (Signed)
msg left advising Rx sent to the pharmacy.      KP

## 2012-11-24 NOTE — Addendum Note (Signed)
Addended by: Arnette Norris on: 11/24/2012 09:21 AM   Modules accepted: Orders

## 2012-12-01 ENCOUNTER — Telehealth: Payer: Self-pay | Admitting: *Deleted

## 2012-12-01 ENCOUNTER — Ambulatory Visit
Admission: RE | Admit: 2012-12-01 | Discharge: 2012-12-01 | Disposition: A | Discharge status: Home / Self Care | Payer: Medicare PPO | Source: Ambulatory Visit | Attending: Family Medicine | Admitting: Family Medicine

## 2012-12-01 ENCOUNTER — Encounter: Payer: Self-pay | Admitting: Gastroenterology

## 2012-12-01 DIAGNOSIS — Z1239 Encounter for other screening for malignant neoplasm of breast: Secondary | ICD-10-CM

## 2012-12-01 DIAGNOSIS — E2839 Other primary ovarian failure: Secondary | ICD-10-CM

## 2012-12-01 NOTE — Telephone Encounter (Signed)
L/M for pt to return call to schedule her [facility] recall colonoscopy with [facility]

## 2012-12-13 ENCOUNTER — Other Ambulatory Visit (HOSPITAL_COMMUNITY): Payer: Medicare PPO

## 2013-01-03 ENCOUNTER — Other Ambulatory Visit: Payer: Self-pay | Admitting: Family Medicine

## 2013-01-04 NOTE — Telephone Encounter (Signed)
Gave paperwork back to [employer] get a call back to schedule

## 2013-01-06 ENCOUNTER — Encounter: Payer: Self-pay | Admitting: Gastroenterology

## 2013-01-09 ENCOUNTER — Encounter: Payer: Self-pay | Admitting: Family Medicine

## 2013-01-11 ENCOUNTER — Telehealth: Payer: Self-pay | Admitting: Family Medicine

## 2013-01-11 MED ORDER — LOSARTAN POTASSIUM 100 MG PO TABS: ORAL_TABLET | ORAL | Status: DC

## 2013-01-11 MED ORDER — BUPROPION HCL ER (XL) 150 MG PO TB24: ORAL_TABLET | ORAL | Status: DC

## 2013-01-11 NOTE — Telephone Encounter (Signed)
Patient called her [facility] pharmacy and was told that her refill requests for buPROPion and losartan was denied because she needed an office visit. I called the pharmacy to confirm and was told that these prescription were denied on their end. Please re-send prescriptions. Patient has an upcoming appointment on 01/19/13.

## 2013-01-11 NOTE — Telephone Encounter (Signed)
Rx was not sent in from the pharmacy. Patient is not due into the office until November. Rx sent and the apt has been cancelled and the patient agreed.      KP

## 2013-01-19 ENCOUNTER — Ambulatory Visit: Payer: Medicare PPO | Admitting: Family Medicine

## 2013-04-12 ENCOUNTER — Encounter: Payer: Self-pay | Admitting: Family Medicine

## 2013-04-12 ENCOUNTER — Ambulatory Visit (INDEPENDENT_AMBULATORY_CARE_PROVIDER_SITE_OTHER): Payer: Medicare PPO | Admitting: Family Medicine

## 2013-04-12 VITALS — BP 187/98 | HR 67 | Temp 98.1°F | Wt 263.6 lb

## 2013-04-12 DIAGNOSIS — J309 Allergic rhinitis, unspecified: Secondary | ICD-10-CM

## 2013-04-12 DIAGNOSIS — F411 Generalized anxiety disorder: Secondary | ICD-10-CM

## 2013-04-12 DIAGNOSIS — E785 Hyperlipidemia, unspecified: Secondary | ICD-10-CM

## 2013-04-12 DIAGNOSIS — F3289 Other specified depressive episodes: Secondary | ICD-10-CM

## 2013-04-12 DIAGNOSIS — F329 Major depressive disorder, single episode, unspecified: Secondary | ICD-10-CM

## 2013-04-12 DIAGNOSIS — I10 Essential (primary) hypertension: Secondary | ICD-10-CM

## 2013-04-12 DIAGNOSIS — Z23 Encounter for immunization: Secondary | ICD-10-CM

## 2013-04-12 LAB — BASIC METABOLIC PANEL
BUN: 10 mg/dL (ref 6–23)
CO2: 32 mEq/L (ref 19–32)
Calcium: 9.5 mg/dL (ref 8.4–10.5)
Chloride: 101 mEq/L (ref 96–112)
Creatinine, Ser: 0.7 mg/dL (ref 0.4–1.2)
GFR: 106.04 mL/min (ref >60.00)
Glucose, Bld: 82 mg/dL (ref 70–99)
Potassium: 4 mEq/L (ref 3.5–5.1)

## 2013-04-12 LAB — LIPID PANEL
Cholesterol: 180 mg/dL (ref 0–200)
HDL: 40.8 mg/dL (ref >39.00)
LDL Cholesterol: 122 mg/dL — ABNORMAL HIGH (ref 0–99)
Total CHOL/HDL Ratio: 4
Triglycerides: 84 mg/dL (ref 0.0–149.0)
VLDL: 16.8 mg/dL (ref 0.0–40.0)

## 2013-04-12 LAB — POCT URINALYSIS DIPSTICK
Bilirubin, UA: NEGATIVE
Blood, UA: NEGATIVE
Glucose, UA: NEGATIVE
Leukocytes, UA: NEGATIVE
Nitrite, UA: NEGATIVE
Protein, UA: NEGATIVE
Spec Grav, UA: >=1.03
Urobilinogen, UA: 0.2
pH, UA: 6

## 2013-04-12 LAB — MICROALBUMIN / CREATININE URINE RATIO
Creatinine,U: 261.5 mg/dL
Microalb Creat Ratio: 0.4 mg/g (ref 0.0–30.0)

## 2013-04-12 LAB — HEPATIC FUNCTION PANEL
ALT: 15 U/L (ref 0–35)
AST: 14 U/L (ref 0–37)
Bilirubin, Direct: 0 mg/dL (ref 0.0–0.3)
Total Bilirubin: 0.3 mg/dL (ref 0.3–1.2)
Total Protein: 7.7 g/dL (ref 6.0–8.3)

## 2013-04-12 MED ORDER — METOPROLOL TARTRATE 100 MG PO TABS: 100.0000 mg | ORAL_TABLET | Freq: Two times a day (BID) | ORAL | Status: DC

## 2013-04-12 MED ORDER — LOSARTAN POTASSIUM 100 MG PO TABS: ORAL_TABLET | ORAL | Status: DC

## 2013-04-12 MED ORDER — SERTRALINE HCL 50 MG PO TABS: 50.0000 mg | ORAL_TABLET | Freq: Every day | ORAL | Status: DC

## 2013-04-12 MED ORDER — AMLODIPINE BESYLATE 2.5 MG PO TABS: 2.5000 mg | ORAL_TABLET | Freq: Every day | ORAL | Status: DC

## 2013-04-12 MED ORDER — VALSARTAN 320 MG PO TABS: 320.0000 mg | ORAL_TABLET | Freq: Every day | ORAL | Status: DC

## 2013-04-12 MED ORDER — EZETIMIBE 10 MG PO TABS: 10.0000 mg | ORAL_TABLET | Freq: Every day | ORAL | Status: DC

## 2013-04-12 MED ORDER — BUPROPION HCL ER (XL) 150 MG PO TB24: ORAL_TABLET | ORAL | Status: DC

## 2013-04-12 MED ORDER — SPIRONOLACTONE 50 MG PO TABS: 50.0000 mg | ORAL_TABLET | Freq: Every day | ORAL | Status: DC

## 2013-04-12 MED ORDER — ATORVASTATIN CALCIUM 20 MG PO TABS: 20.0000 mg | ORAL_TABLET | Freq: Every day | ORAL | Status: DC

## 2013-04-12 NOTE — Assessment & Plan Note (Signed)
Check labs Refill meds  

## 2013-04-12 NOTE — Assessment & Plan Note (Signed)
High--- pt has not taken meds in a while Refill meds Recheck 3 months

## 2013-04-12 NOTE — Assessment & Plan Note (Signed)
Refill meds

## 2013-04-12 NOTE — Patient Instructions (Addendum)
rto 3 weeks bp f/u    Hypertension  As your heart beats, it forces blood through your arteries. This force is your blood pressure. If the pressure is too high, it is called hypertension (HTN) or high blood pressure. HTN is dangerous because you may have it and not know it. High blood pressure may mean that your heart has to work harder to pump blood. Your arteries may be narrow or stiff. The extra work puts you at risk for heart disease, stroke, and other problems.  Blood pressure consists of two numbers, a higher number over a lower, 110/72, for example. It is stated as "110 over 72." The ideal is below 120 for the top number (systolic) and under 80 for the bottom (diastolic). Write down your blood pressure today. You should pay close attention to your blood pressure if you have certain conditions such as:  Heart failure.  Prior heart attack.  Diabetes  Chronic kidney disease.  Prior stroke.  Multiple risk factors for heart disease. To see if you have HTN, your blood pressure should be measured while you are seated with your arm held at the level of the heart. It should be measured at least twice. A one-time elevated blood pressure reading (especially in the Emergency Department) does not mean that you need treatment. There may be conditions in which the blood pressure is different between your right and left arms. It is important to see your caregiver soon for a recheck. Most people have essential hypertension which means that there is not a specific cause. This type of high blood pressure may be lowered by changing lifestyle factors such as:  Stress.  Smoking.  Lack of exercise.  Excessive weight.  Drug/tobacco/alcohol use.  Eating less salt. Most people do not have symptoms from high blood pressure until it has caused damage to the body. Effective treatment can often prevent, delay or reduce that damage. TREATMENT  When a cause has been identified, treatment for high blood  pressure is directed at the cause. There are a large number of medications to treat HTN. These fall into several categories, and your caregiver will help you select the medicines that are best for you. Medications may have side effects. You should review side effects with your caregiver. If your blood pressure stays high after you have made lifestyle changes or started on medicines,   Your medication(s) may need to be changed.  Other problems may need to be addressed.  Be certain you understand your prescriptions, and know how and when to take your medicine.  Be sure to follow up with your caregiver within the time frame advised (usually within two weeks) to have your blood pressure rechecked and to review your medications.  If you are taking more than one medicine to lower your blood pressure, make sure you know how and at what times they should be taken. Taking two medicines at the same time can result in blood pressure that is too low. SEEK IMMEDIATE MEDICAL CARE IF:  You develop a severe headache, blurred or changing vision, or confusion.  You have unusual weakness or numbness, or a faint feeling.  You have severe chest or abdominal pain, vomiting, or breathing problems. MAKE SURE YOU:   Understand these instructions.  Will watch your condition.  Will get help right away if you are not doing well or get worse. Document Released: 05/25/2005 Document Revised: 08/17/2011 Document Reviewed: 01/13/2008 [facility] Patient Information 2014 [place], [place].

## 2013-04-12 NOTE — Progress Notes (Signed)
  Subjective:    Patient here for follow-up of elevated blood pressure.  She is not exercising and is adherent to a low-salt diet.  Blood pressure is not well controlled at home. Cardiac symptoms: none. Patient denies: chest pain, chest pressure/discomfort, claudication, dyspnea, exertional chest pressure/discomfort, fatigue, irregular heart beat, lower extremity edema, near-syncope, orthopnea, palpitations, paroxysmal nocturnal dyspnea, syncope and tachypnea. Cardiovascular risk factors: advanced age (older than 91 for men, 3 for women), dyslipidemia, hypertension, obesity (BMI >= 30 kg/m2) and sedentary lifestyle. Use of agents associated with hypertension: none. History of target organ damage: none.  The following portions of the patient's history were reviewed and updated as appropriate: allergies, current medications, past family history, past medical history, past social history, past surgical history and problem list.  Review of Systems Pertinent items are noted in HPI.     Objective:    BP 187/98  Pulse 67  Temp(Src) 98.1 F (36.7 C) (Oral)  Wt 263 lb 9.6 oz (119.568 kg)  SpO2 97% General appearance: alert, cooperative, appears stated age and no distress Throat: lips, mucosa, and tongue normal; teeth and gums normal Neck: no adenopathy, no carotid bruit, no JVD, supple, symmetrical, trachea midline and thyroid not enlarged, symmetric, no tenderness/mass/nodules Lungs: clear to auscultation bilaterally Heart: S1, S2 normal Extremities: extremities normal, atraumatic, no cyanosis or edema    Assessment:    Hypertension, stage 2 . Evidence of target organ damage: none.    Plan:    Medication: no change. Dietary sodium restriction. Regular aerobic exercise. Check blood pressures 2-3 times weekly and record. Follow up: 3 weeks and as needed.

## 2013-04-17 ENCOUNTER — Encounter: Payer: Self-pay | Admitting: *Deleted

## 2013-04-25 ENCOUNTER — Telehealth: Payer: Self-pay | Admitting: *Deleted

## 2013-04-25 NOTE — Telephone Encounter (Signed)
Patient called and state that she received a letter from her pharmacy that her cholesterol medication can not be filled. Called patient to ask exactly what the letter stated. Left message to return call to office.

## 2013-07-13 ENCOUNTER — Other Ambulatory Visit: Payer: Self-pay | Admitting: Family Medicine

## 2013-07-13 NOTE — Telephone Encounter (Signed)
Last seen 04/12/13 and filled 10/06/12 #60 with 2 refills. Please advise     KP

## 2013-07-18 ENCOUNTER — Other Ambulatory Visit (INDEPENDENT_AMBULATORY_CARE_PROVIDER_SITE_OTHER): Payer: Medicare PPO

## 2013-07-18 DIAGNOSIS — E785 Hyperlipidemia, unspecified: Secondary | ICD-10-CM

## 2013-07-18 LAB — HEPATIC FUNCTION PANEL
ALK PHOS: 66 U/L (ref 39–117)
ALT: 14 U/L (ref 0–35)
AST: 13 U/L (ref 0–37)
Albumin: 3.7 g/dL (ref 3.5–5.2)
BILIRUBIN DIRECT: 0 mg/dL (ref 0.0–0.3)
TOTAL PROTEIN: 7.6 g/dL (ref 6.0–8.3)
Total Bilirubin: 0.5 mg/dL (ref 0.3–1.2)

## 2013-07-18 LAB — LIPID PANEL
CHOLESTEROL: 196 mg/dL (ref 0–200)
HDL: 35.7 mg/dL — ABNORMAL LOW (ref >39.00)
LDL CALC: 145 mg/dL — AB (ref 0–99)
Total CHOL/HDL Ratio: 5
Triglycerides: 79 mg/dL (ref 0.0–149.0)
VLDL: 15.8 mg/dL (ref 0.0–40.0)

## 2013-07-20 ENCOUNTER — Other Ambulatory Visit: Payer: Self-pay

## 2013-07-20 MED ORDER — LOVASTATIN 40 MG PO TABS: 40.0000 mg | ORAL_TABLET | Freq: Every day | ORAL | Status: DC

## 2014-04-04 ENCOUNTER — Other Ambulatory Visit: Payer: Self-pay

## 2014-04-04 DIAGNOSIS — I10 Essential (primary) hypertension: Secondary | ICD-10-CM

## 2014-04-04 MED ORDER — SPIRONOLACTONE 50 MG PO TABS: 50.0000 mg | ORAL_TABLET | Freq: Every day | ORAL | Status: DC

## 2014-07-12 ENCOUNTER — Encounter: Payer: Self-pay | Admitting: Family Medicine

## 2014-07-12 ENCOUNTER — Ambulatory Visit (INDEPENDENT_AMBULATORY_CARE_PROVIDER_SITE_OTHER): Payer: Medicare PPO | Admitting: Family Medicine

## 2014-07-12 VITALS — BP 144/70 | HR 79 | Temp 98.6°F | Wt 273.0 lb

## 2014-07-12 DIAGNOSIS — M159 Polyosteoarthritis, unspecified: Secondary | ICD-10-CM

## 2014-07-12 DIAGNOSIS — I10 Essential (primary) hypertension: Secondary | ICD-10-CM

## 2014-07-12 DIAGNOSIS — R8290 Unspecified abnormal findings in urine: Secondary | ICD-10-CM

## 2014-07-12 DIAGNOSIS — Z23 Encounter for immunization: Secondary | ICD-10-CM

## 2014-07-12 DIAGNOSIS — F329 Major depressive disorder, single episode, unspecified: Secondary | ICD-10-CM

## 2014-07-12 DIAGNOSIS — R739 Hyperglycemia, unspecified: Secondary | ICD-10-CM

## 2014-07-12 DIAGNOSIS — E785 Hyperlipidemia, unspecified: Secondary | ICD-10-CM

## 2014-07-12 DIAGNOSIS — M150 Primary generalized (osteo)arthritis: Secondary | ICD-10-CM

## 2014-07-12 DIAGNOSIS — F32A Depression, unspecified: Secondary | ICD-10-CM

## 2014-07-12 LAB — HEPATIC FUNCTION PANEL
ALT: 12 U/L (ref 0–35)
AST: 14 U/L (ref 0–37)
Albumin: 4.1 g/dL (ref 3.5–5.2)
Alkaline Phosphatase: 69 U/L (ref 39–117)
Bilirubin, Direct: 0.1 mg/dL (ref 0.0–0.3)
Total Bilirubin: 0.6 mg/dL (ref 0.2–1.2)
Total Protein: 7.4 g/dL (ref 6.0–8.3)

## 2014-07-12 LAB — POCT URINALYSIS DIPSTICK
BILIRUBIN UA: NEGATIVE
Blood, UA: NEGATIVE
Glucose, UA: NEGATIVE
Leukocytes, UA: NEGATIVE
NITRITE UA: NEGATIVE
PH UA: 6
Spec Grav, UA: >=1.03
UROBILINOGEN UA: NEGATIVE

## 2014-07-12 LAB — LIPID PANEL
CHOL/HDL RATIO: 4
Cholesterol: 188 mg/dL (ref 0–200)
HDL: 41.8 mg/dL (ref >39.00)
LDL Cholesterol: 126 mg/dL — ABNORMAL HIGH (ref 0–99)
NONHDL: 146.2
Triglycerides: 103 mg/dL (ref 0.0–149.0)
VLDL: 20.6 mg/dL (ref 0.0–40.0)

## 2014-07-12 LAB — BASIC METABOLIC PANEL
BUN: 15 mg/dL (ref 6–23)
CO2: 31 mEq/L (ref 19–32)
Calcium: 9.5 mg/dL (ref 8.4–10.5)
Chloride: 103 mEq/L (ref 96–112)
Creatinine, Ser: 0.85 mg/dL (ref 0.40–1.20)
GFR: 85.83 mL/min (ref >60.00)
Glucose, Bld: 86 mg/dL (ref 70–99)
Potassium: 4.1 mEq/L (ref 3.5–5.1)
Sodium: 138 mEq/L (ref 135–145)

## 2014-07-12 LAB — MICROALBUMIN / CREATININE URINE RATIO
CREATININE, U: 277.5 mg/dL
Microalb Creat Ratio: 0.4 mg/g (ref 0.0–30.0)
Microalb, Ur: 1 mg/dL (ref 0.0–1.9)

## 2014-07-12 LAB — HEMOGLOBIN A1C: Hgb A1c MFr Bld: 6 % (ref 4.6–6.5)

## 2014-07-12 MED ORDER — SERTRALINE HCL 100 MG PO TABS: 100.0000 mg | ORAL_TABLET | Freq: Every day | ORAL | Status: DC

## 2014-07-12 MED ORDER — BUPROPION HCL ER (XL) 150 MG PO TB24: ORAL_TABLET | ORAL | Status: DC

## 2014-07-12 MED ORDER — LOSARTAN POTASSIUM 100 MG PO TABS: ORAL_TABLET | ORAL | Status: DC

## 2014-07-12 MED ORDER — METOPROLOL TARTRATE 100 MG PO TABS: 100.0000 mg | ORAL_TABLET | Freq: Two times a day (BID) | ORAL | Status: DC

## 2014-07-12 MED ORDER — LOVASTATIN 40 MG PO TABS: 40.0000 mg | ORAL_TABLET | Freq: Every day | ORAL | Status: DC

## 2014-07-12 MED ORDER — TRAMADOL HCL 50 MG PO TABS: ORAL_TABLET | ORAL | Status: DC

## 2014-07-12 MED ORDER — SPIRONOLACTONE 50 MG PO TABS: 50.0000 mg | ORAL_TABLET | Freq: Every day | ORAL | Status: DC

## 2014-07-12 NOTE — Progress Notes (Signed)
Subjective:    Patient ID: Michaela Martinez, female    DOB: 1945-12-29, 69 y.o.   MRN: [id number]  HPI  Patient here for htn  Past Medical History  Diagnosis Date  . Menopausal syndrome   . Depression   . Hypertension     Review of Systems  Constitutional: Negative for activity change, appetite change, fatigue and unexpected weight change.  Respiratory: Negative for cough and shortness of breath.   Cardiovascular: Negative for chest pain, palpitations and leg swelling.  Psychiatric/Behavioral: Negative for behavioral problems, confusion, dysphoric mood and decreased concentration. The patient is not nervous/anxious.        Objective:    Physical Exam  Constitutional: She is oriented to person, place, and time. She appears well-developed and well-nourished. No distress.  HENT:  Right Ear: External ear normal.  Left Ear: External ear normal.  Nose: Nose normal.  Mouth/Throat: Oropharynx is clear and moist.  Eyes: EOM are normal. Pupils are equal, round, and reactive to light.  Neck: Normal range of motion. Neck supple. No tracheal deviation present. No thyromegaly present.  Cardiovascular: Normal rate, regular rhythm and normal heart sounds.   No murmur heard. Pulmonary/Chest: Effort normal and breath sounds normal. No respiratory distress. She has no wheezes. She has no rales. She exhibits no tenderness.  Musculoskeletal: Normal range of motion. She exhibits no edema or tenderness.  Lymphadenopathy:    She has no cervical adenopathy.  Neurological: She is alert and oriented to person, place, and time.  Psychiatric: She has a normal mood and affect. Her behavior is normal. Judgment and thought content normal.    BP 144/70 mmHg  Pulse 79  Temp(Src) 98.6 F (37 C) (Oral)  Wt 273 lb (123.832 kg)  SpO2 96% Wt Readings from Last 3 Encounters:  07/12/14 273 lb (123.832 kg)  04/12/13 263 lb 9.6 oz (119.568 kg)  11/02/12 273 lb (123.832 kg)     Lab Results  Component  Value Date   WBC 9.1 10/06/2012   HGB 12.7 10/06/2012   HCT 38.8 10/06/2012   PLT 225.0 10/06/2012   GLUCOSE 86 07/12/2014   CHOL 188 07/12/2014   TRIG 103.0 07/12/2014   HDL 41.80 07/12/2014   LDLDIRECT 148.0 10/14/2010   LDLCALC 126* 07/12/2014   ALT 12 07/12/2014   AST 14 07/12/2014   NA 138 07/12/2014   K 4.1 07/12/2014   CL 103 07/12/2014   CREATININE 0.85 07/12/2014   BUN 15 07/12/2014   CO2 31 07/12/2014   TSH 1.31 11/09/2008   HGBA1C 6.0 07/12/2014   MICROALBUR 1.0 07/12/2014    Dg Bone Density  12/01/2012   Original report by Dr. Shon Hale, dictated inadvertently under incorrect accession number.  *RADIOLOGY REPORT*  Clinical Data: History of estrogen deficiency.  Postmenopausal. The patient takes Zoloft and Wellbutrin.  Vitamin D supplementation.    DUAL X-RAY ABSORPTIOMETRY (DXA) FOR BONE MINERAL DENSITY  AP LUMBAR SPINE EXCLUDED BECAUSE OF DEGENERATIVE CHANGES  LEFT FEMUR NECK  Bone Mineral Density (BMD):             0.874 g/cm2  Young Adult T Score:                           0.2  Z Score:  0.7  LEFT FOREARM (1/3 RADIUS)  Bone Mineral Density (BMD):                     0.798 g/cm2  Young Adult T Score:                                  1.7  Z Score:                            3.4  ASSESSMENT:  Patient's diagnostic category is NORMAL by WHO Criteria.  FRACTURE RISK: Not increased   FRAX: World Health Organization FRAX assessment of absolute fracture risk is not calculated for this patient because the patient has T scores in the normal range.  .  Comparison: None  RECOMMENDATIONS:  All patients should ensure an adequate intake of dietary calcium (1200mg  daily) and vitamin D (800 IU daily) unless contraindicated. The National Osteoporosis Foundation recommends that FDA-approved medical therapies be considered in postmenopausal women and mean age 77 or older with a:  1)    Hip or vertebral (clinical or morphometric) fracture.  2)    T-score of -2.5 or lower at the spine or hip.  3)   Ten-year fracture probability by FRAX of 3% or greater for hip fracture or 20% or greater for major osteoporotic fracture.  FOLLOW-UP:  People with diagnosed cases of osteoporosis or at high risk for fracture should have regular bone mineral density tests.  For patients eligible for Medicare, routine testing is allowed once every 2 years.  The testing frequency can be increased to one year for patients who have rapidly progressing disease, those who are receiving or discontinuing medical therapy to restore bone mass, or have additional risk factors.   Original Report Authenticated By: Conchita Paris, M.D.   Mm Digital Screening  12/01/2012   **ADDENDUM** CREATED: 12/01/2012 11:38:36  The patient's bone densitometry exam performed on the same date was inadvertently dictated under the mammography report.  This is the correct report corresponding to the patient's mammogram today.  *RADIOLOGY REPORT*  Clinical Data: Screening.  DIGITAL SCREENING BILATERAL MAMMOGRAM WITH CAD  Comparison:  None.  FINDINGS:  ACR Breast Density Category b:  There are scattered areas of fibroglandular density.  There are no findings suspicious for malignancy.  Images were processed with CAD.  IMPRESSION: No mammographic evidence of malignancy.  A result letter of this screening mammogram will be mailed directly to the patient.  RECOMMENDATION: Screening mammogram in one year. (Code:[id number]-B-01Y)  BI-RADS CATEGORY 1:  Negative.  Addended by:  Arline Asp, M.D. on 12/01/2012 11:38:36.  **END ADDENDUM** SIGNED BY: Arline Asp, M.D.  12/01/2012   *RADIOLOGY REPORT*  Clinical Data: History of estrogen deficiency.  Postmenopausal. The patient takes Zoloft and Wellbutrin.  Vitamin D supplementation.  DUAL X-RAY ABSORPTIOMETRY (DXA) FOR BONE MINERAL DENSITY  AP LUMBAR SPINE EXCLUDED BECAUSE OF DEGENERATIVE CHANGES  LEFT FEMUR NECK  Bone Mineral Density (BMD):             0.874 g/cm2 Young  Adult T Score:                           0.2 Z Score:  0.7  LEFT FOREARM (1/3 RADIUS)  Bone Mineral Density (BMD):                     0.798 g/cm2 Young Adult T Score:                                  1.7 Z Score:                            3.4  ASSESSMENT:  Patient's diagnostic category is NORMAL by WHO Criteria.  FRACTURE RISK: Not increased  FRAX: World Health Organization FRAX assessment of absolute fracture risk is not calculated for this patient because the patient has T scores in the normal range.  Comparison: None  RECOMMENDATIONS:  All patients should ensure an adequate intake of dietary calcium (1200mg  daily) and vitamin D (800 IU daily) unless contraindicated. The National Osteoporosis Foundation recommends that FDA-approved medical therapies be considered in postmenopausal women and mean age 54 or older with a:  1)    Hip or vertebral (clinical or morphometric) fracture.  2)   T-score of -2.5 or lower at the spine or hip.  3)   Ten-year fracture probability by FRAX of 3% or greater for hip fracture or 20% or greater for major osteoporotic fracture.  FOLLOW-UP:  People with diagnosed cases of osteoporosis or at high risk for fracture should have regular bone mineral density tests.  For patients eligible for Medicare, routine testing is allowed once every 2 years.  The testing frequency can be increased to one year for patients who have rapidly progressing disease, those who are receiving or discontinuing medical therapy to restore bone mass, or have additional risk factors.   Original Report Authenticated By: Nolon Nations, M.D.       Assessment & Plan:   Problem List Items Addressed This Visit    Hyperlipidemia   Relevant Medications   metoprolol (LOPRESSOR) tablet   lovastatin (MEVACOR) 40 MG tablet   spironolactone (ALDACTONE) tablet   losartan (COZAAR) tablet   Other Relevant Orders   Basic metabolic panel (Completed)   Hepatic function  panel (Completed)   Lipid panel (Completed)   Microalbumin / creatinine urine ratio (Completed)   POCT urinalysis dipstick (Completed)    Other Visit Diagnoses    Need for prophylactic vaccination and inoculation against influenza    -  Primary    Relevant Orders    Flu Vaccine QUAD 36+ mos PF IM (Fluarix Quad PF) (Completed)    Essential hypertension        Relevant Medications    metoprolol (LOPRESSOR) tablet    lovastatin (MEVACOR) 40 MG tablet    spironolactone (ALDACTONE) tablet    losartan (COZAAR) tablet    Other Relevant Orders    Basic metabolic panel (Completed)    Hepatic function panel (Completed)    Lipid panel (Completed)    Microalbumin / creatinine urine ratio (Completed)    POCT urinalysis dipstick (Completed)    Hyperglycemia        Relevant Orders    Hemoglobin A1c (Completed)    Depression        Relevant Medications    buPROPion (WELLBUTRIN XL) 24 hr tablet    sertraline (ZOLOFT) tablet    Primary osteoarthritis involving multiple joints        Relevant Medications    traMADol (ULTRAM) 50 MG  tablet    Need for vaccination with 13-polyvalent pneumococcal conjugate vaccine        Relevant Orders    Pneumococcal conjugate vaccine 13-valent IM (Completed)    Abnormal urine        Relevant Orders    Urine Culture        Garnet Koyanagi, DO

## 2014-07-12 NOTE — Patient Instructions (Signed)

## 2014-07-12 NOTE — Progress Notes (Signed)
Pre visit review using our clinic review tool, if applicable. No additional management support is needed unless otherwise documented below in the visit note. 

## 2014-07-13 LAB — URINE CULTURE
COLONY COUNT: NO GROWTH
ORGANISM ID, BACTERIA: NO GROWTH

## 2014-08-16 ENCOUNTER — Encounter: Payer: Medicare PPO | Admitting: Family Medicine

## 2014-08-30 ENCOUNTER — Ambulatory Visit (HOSPITAL_BASED_OUTPATIENT_CLINIC_OR_DEPARTMENT_OTHER)
Admission: RE | Admit: 2014-08-30 | Discharge: 2014-08-30 | Disposition: A | Discharge status: Home / Self Care | Payer: Medicare PPO | Source: Ambulatory Visit | Attending: Family Medicine | Admitting: Family Medicine

## 2014-08-30 ENCOUNTER — Ambulatory Visit (INDEPENDENT_AMBULATORY_CARE_PROVIDER_SITE_OTHER): Payer: Medicare PPO | Admitting: Family Medicine

## 2014-08-30 ENCOUNTER — Encounter: Payer: Self-pay | Admitting: Family Medicine

## 2014-08-30 VITALS — BP 150/80 | HR 83 | Temp 98.7°F | Wt 268.4 lb

## 2014-08-30 DIAGNOSIS — R05 Cough: Secondary | ICD-10-CM

## 2014-08-30 DIAGNOSIS — J9801 Acute bronchospasm: Secondary | ICD-10-CM | POA: Diagnosis not present

## 2014-08-30 DIAGNOSIS — J302 Other seasonal allergic rhinitis: Secondary | ICD-10-CM | POA: Diagnosis not present

## 2014-08-30 DIAGNOSIS — R21 Rash and other nonspecific skin eruption: Secondary | ICD-10-CM | POA: Diagnosis not present

## 2014-08-30 DIAGNOSIS — R0989 Other specified symptoms and signs involving the circulatory and respiratory systems: Secondary | ICD-10-CM | POA: Insufficient documentation

## 2014-08-30 DIAGNOSIS — I10 Essential (primary) hypertension: Secondary | ICD-10-CM | POA: Diagnosis not present

## 2014-08-30 DIAGNOSIS — R059 Cough, unspecified: Secondary | ICD-10-CM

## 2014-08-30 MED ORDER — SPIRONOLACTONE 50 MG PO TABS: 50.0000 mg | ORAL_TABLET | Freq: Every day | ORAL | Status: DC

## 2014-08-30 MED ORDER — IPRATROPIUM-ALBUTEROL 0.5-2.5 (3) MG/3ML IN SOLN
3.0000 mL | Freq: Once | RESPIRATORY_TRACT | Status: AC
  Administered 2014-08-30: 3 mL via RESPIRATORY_TRACT

## 2014-08-30 MED ORDER — PREDNISONE 10 MG PO TABS: ORAL_TABLET | ORAL | Status: DC

## 2014-08-30 MED ORDER — TRIAMCINOLONE ACETONIDE 0.5 % EX CREA: 1.0000 "application " | TOPICAL_CREAM | Freq: Three times a day (TID) | CUTANEOUS | Status: DC

## 2014-08-30 NOTE — Patient Instructions (Addendum)
Get an antihistamine otc--- ie, claritin, allegra, or zyrtec Also get a steroid nasal spray-- flonase, nasacort or rhinocort---otc     Allergies Allergies may happen from anything your body is sensitive to. This may be food, medicines, pollens, chemicals, and nearly anything around you in everyday life that produces allergens. An allergen is anything that causes an allergy producing substance. Heredity is often a factor in causing these problems. This means you may have some of the same allergies as your parents. Food allergies happen in all age groups. Food allergies are some of the most severe and life threatening. Some common food allergies are cow's milk, seafood, eggs, nuts, wheat, and soybeans. SYMPTOMS   Swelling around the mouth.  An itchy red rash or hives.  Vomiting or diarrhea.  Difficulty breathing. SEVERE ALLERGIC REACTIONS ARE LIFE-THREATENING. This reaction is called anaphylaxis. It can cause the mouth and throat to swell and cause difficulty with breathing and swallowing. In severe reactions only a trace amount of food (for example, peanut oil in a salad) may cause death within seconds. Seasonal allergies occur in all age groups. These are seasonal because they usually occur during the same season every year. They may be a reaction to molds, grass pollens, or tree pollens. Other causes of problems are house dust mite allergens, pet dander, and mold spores. The symptoms often consist of nasal congestion, a runny itchy nose associated with sneezing, and tearing itchy eyes. There is often an associated itching of the mouth and ears. The problems happen when you come in contact with pollens and other allergens. Allergens are the particles in the air that the body reacts to with an allergic reaction. This causes you to release allergic antibodies. Through a chain of events, these eventually cause you to release histamine into the blood stream. Although it is meant to be protective to  the body, it is this release that causes your discomfort. This is why you were given anti-histamines to feel better. If you are unable to pinpoint the offending allergen, it may be determined by skin or blood testing. Allergies cannot be cured but can be controlled with medicine. Hay fever is a collection of all or some of the seasonal allergy problems. It may often be treated with simple over-the-counter medicine such as diphenhydramine. Take medicine as directed. Do not drink alcohol or drive while taking this medicine. Check with your caregiver or package insert for child dosages. If these medicines are not effective, there are many new medicines your caregiver can prescribe. Stronger medicine such as nasal spray, eye drops, and corticosteroids may be used if the first things you try do not work well. Other treatments such as immunotherapy or desensitizing injections can be used if all else fails. Follow up with your caregiver if problems continue. These seasonal allergies are usually not life threatening. They are generally more of a nuisance that can often be handled using medicine. HOME CARE INSTRUCTIONS   If unsure what causes a reaction, keep a diary of foods eaten and symptoms that follow. Avoid foods that cause reactions.  If hives or rash are present:  Take medicine as directed.  You may use an over-the-counter antihistamine (diphenhydramine) for hives and itching as needed.  Apply cold compresses (cloths) to the skin or take baths in cool water. Avoid hot baths or showers. Heat will make a rash and itching worse.  If you are severely allergic:  Following a treatment for a severe reaction, hospitalization is often required for closer follow-up.  Wear a medic-alert bracelet or necklace stating the allergy.  You and your family must learn how to give adrenaline or use an anaphylaxis kit.  If you have had a severe reaction, always carry your anaphylaxis kit or EpiPen with you. Use  this medicine as directed by your caregiver if a severe reaction is occurring. Failure to do so could have a fatal outcome. SEEK MEDICAL CARE IF:  You suspect a food allergy. Symptoms generally happen within 30 minutes of eating a food.  Your symptoms have not gone away within 2 days or are getting worse.  You develop new symptoms.  You want to retest yourself or your child with a food or drink you think causes an allergic reaction. Never do this if an anaphylactic reaction to that food or drink has happened before. Only do this under the care of a caregiver. SEEK IMMEDIATE MEDICAL CARE IF:   You have difficulty breathing, are wheezing, or have a tight feeling in your chest or throat.  You have a swollen mouth, or you have hives, swelling, or itching all over your body.  You have had a severe reaction that has responded to your anaphylaxis kit or an EpiPen. These reactions may return when the medicine has worn off. These reactions should be considered life threatening. MAKE SURE YOU:   Understand these instructions.  Will watch your condition.  Will get help right away if you are not doing well or get worse. Document Released: 08/18/2002 Document Revised: 09/19/2012 Document Reviewed: 01/23/2008 [facility] Patient Information 2015 [place], [place]. This information is not intended to replace advice given to you by your health care provider. Make sure you discuss any questions you have with your health care provider.

## 2014-08-30 NOTE — Progress Notes (Signed)
Pre visit review using our clinic review tool, if applicable. No additional management support is needed unless otherwise documented below in the visit note. 

## 2014-08-30 NOTE — Progress Notes (Signed)
Subjective:     Michaela Martinez is a 69 y.o. female here for evaluation of a cough. Onset of symptoms was 5 days ago. Symptoms have been gradually worsening since that time. The cough is barky and dry and is aggravated by reclining position. Associated symptoms include: postnasal drip and wheezing. Patient does not have a history of asthma. Patient does have a history of environmental allergens. Patient has not traveled recently. Patient does not have a history of smoking. Patient has not had a previous chest x-ray. Patient has not had a PPD done.  The following portions of the patient's history were reviewed and updated as appropriate:  She  has a past medical history of Menopausal syndrome; Depression; and Hypertension. She  does not have any pertinent problems on file. She  has past surgical history that includes Inguinal hernia repair (2000) and Tubal ligation (1970). Her family history includes Alcohol abuse in an other family member; Asthma in her son; Breast cancer in her maternal grandmother; Cirrhosis in her brother; Depression in an other family member; Diabetes in an other family member; Hypertension in an other family member; Liver disease in an other family member; Pancreatic cancer in her mother and another family member. She  reports that she has never smoked. She has never used smokeless tobacco. She reports that she does not drink alcohol or use illicit drugs. She has a current medication list which includes the following prescription(s): vitamin d, losartan, lovastatin, metoprolol, sertraline, spironolactone, tramadol, triamcinolone cream, and bupropion. Current Outpatient Prescriptions on File Prior to Visit  Medication Sig Dispense Refill  . Cholecalciferol (VITAMIN D) 2000 UNITS CAPS Take by mouth.    . losartan (COZAAR) 100 MG tablet take 1 tablet by mouth once daily 90 tablet 3  . lovastatin (MEVACOR) 40 MG tablet Take 1 tablet (40 mg total) by mouth at bedtime. 30 tablet 2  .  metoprolol (LOPRESSOR) 100 MG tablet Take 1 tablet (100 mg total) by mouth 2 (two) times daily. 180 tablet 3  . sertraline (ZOLOFT) 100 MG tablet Take 1 tablet (100 mg total) by mouth daily. 90 tablet 3  . traMADol (ULTRAM) 50 MG tablet take 1 tablet by mouth every 6 hours if needed for pain 60 tablet 2  . buPROPion (WELLBUTRIN XL) 150 MG 24 hr tablet take 3 tablets by mouth once daily (Patient not taking: Reported on 08/30/2014) 270 tablet 3   No current facility-administered medications on file prior to visit.   She is allergic to norvasc.Marland Kitchen  Review of Systems Pertinent items are noted in HPI.    Objective:    Oxygen saturation 96% on room air BP 150/80 mmHg  Pulse 83  Temp(Src) 98.7 F (37.1 C) (Oral)  Wt 268 lb 6.4 oz (121.745 kg)  SpO2 96% General appearance: alert, cooperative, appears stated age and no distress Head: Normocephalic, without obvious abnormality, atraumatic Ears: normal TM's and external ear canals both ears Nose: Nares normal. Septum midline. Mucosa normal. No drainage or sinus tenderness. Throat: lips, mucosa, and tongue normal; teeth and gums normal Neck: no adenopathy, no carotid bruit, no JVD, supple, symmetrical, trachea midline and thyroid not enlarged, symmetric, no tenderness/mass/nodules Lungs: diminished breath sounds bilaterally----improved with duoneb treatment Heart: S1, S2 normal Extremities: extremities normal, atraumatic, no cyanosis or edema    Assessment:    seasonal allergies with bronchospasm    Plan:    Explained lack of efficacy of antibiotics in viral disease. Antitussives per medication orders. Avoid exposure to tobacco smoke and fumes.  B-agonist inhaler. Call if shortness of breath worsens, blood in sputum, change in character of cough, development of fever or chills, inability to maintain nutrition and hydration. Avoid exposure to tobacco smoke and fumes. Chest x-ray. Trial of antihistamines. pred taper

## 2014-08-30 NOTE — Addendum Note (Signed)
Addended by: Ewing Schlein on: 08/30/2014 03:43 PM   Modules accepted: Orders

## 2014-09-03 ENCOUNTER — Telehealth: Payer: Self-pay | Admitting: Family Medicine

## 2014-09-03 MED ORDER — AZITHROMYCIN 250 MG PO TABS: ORAL_TABLET | ORAL | Status: DC

## 2014-09-03 MED ORDER — BENZONATATE 200 MG PO CAPS: 200.0000 mg | ORAL_CAPSULE | Freq: Three times a day (TID) | ORAL | Status: DC | PRN

## 2014-09-03 NOTE — Telephone Encounter (Signed)
Please advise      KP 

## 2014-09-03 NOTE — Telephone Encounter (Signed)
Caller name: Michaela Martinez, Michaela Martinez Relation to pt: self  Call back number: [phone] Pharmacy: [facility] [place], [place]. [phone] ([phone] (Fax)         Reason for call:  Pt states she did not finish medication but still has a dry cough and requesting another RX or clinical advice

## 2014-09-03 NOTE — Telephone Encounter (Signed)
z pack as directed #1   Tessalon perles 200 tid prn cough---#30  Ov later this week if no improvement

## 2014-09-03 NOTE — Telephone Encounter (Signed)
Discussed with patient and she verbalized understanding. Rx has been faxed.     KP

## 2014-09-19 ENCOUNTER — Telehealth: Payer: Self-pay | Admitting: Family Medicine

## 2014-09-19 NOTE — Telephone Encounter (Signed)
Caller name: Kinzy Relation to pt: self Call back number: [phone] Pharmacy: [facility]  Reason for call:   Patient states that she does want to try bupropion hcl 150mg  xl tabs. She says that her and Dr. Etter Sjogren had previously discussed this.

## 2014-09-20 NOTE — Telephone Encounter (Signed)
Spoke with patient and she stated she initially did not want to take the Wellbutrin and the Zoloft together, she stated she actually wants to start the Wellbutrin now because she feels like she needs it. I made her aware the Rx should be at the pharmacy because in 07/12/14 Dr.Lowne enough of both for a full year, she verbalized understanding and she said she would give the pharmacy a call.      KP

## 2014-10-24 ENCOUNTER — Telehealth: Payer: Self-pay | Admitting: Family Medicine

## 2014-10-24 NOTE — Telephone Encounter (Signed)
Pre Visit letter sent  °

## 2014-11-14 ENCOUNTER — Telehealth: Payer: Self-pay

## 2014-11-14 NOTE — Telephone Encounter (Signed)
Pre visit screen completed

## 2014-11-15 ENCOUNTER — Encounter: Payer: Self-pay | Admitting: Family Medicine

## 2014-11-15 ENCOUNTER — Ambulatory Visit (INDEPENDENT_AMBULATORY_CARE_PROVIDER_SITE_OTHER): Payer: Medicare PPO | Admitting: Family Medicine

## 2014-11-15 VITALS — BP 168/88 | HR 70 | Temp 97.9°F | Ht 67.0 in | Wt 261.2 lb

## 2014-11-15 DIAGNOSIS — E2839 Other primary ovarian failure: Secondary | ICD-10-CM

## 2014-11-15 DIAGNOSIS — E785 Hyperlipidemia, unspecified: Secondary | ICD-10-CM

## 2014-11-15 DIAGNOSIS — Z0000 Encounter for general adult medical examination without abnormal findings: Secondary | ICD-10-CM

## 2014-11-15 DIAGNOSIS — Z23 Encounter for immunization: Secondary | ICD-10-CM

## 2014-11-15 DIAGNOSIS — Z1239 Encounter for other screening for malignant neoplasm of breast: Secondary | ICD-10-CM

## 2014-11-15 DIAGNOSIS — I10 Essential (primary) hypertension: Secondary | ICD-10-CM | POA: Diagnosis not present

## 2014-11-15 LAB — CBC WITH DIFFERENTIAL/PLATELET
Basophils Absolute: 0 10*3/uL (ref 0.0–0.1)
Basophils Relative: 0.3 % (ref 0.0–3.0)
Eosinophils Absolute: 0.1 10*3/uL (ref 0.0–0.7)
Eosinophils Relative: 0.8 % (ref 0.0–5.0)
HEMATOCRIT: 41.7 % (ref 36.0–46.0)
Hemoglobin: 13.3 g/dL (ref 12.0–15.0)
Lymphocytes Relative: 22.1 % (ref 12.0–46.0)
Lymphs Abs: 1.9 10*3/uL (ref 0.7–4.0)
MCHC: 31.8 g/dL (ref 30.0–36.0)
MCV: 85.5 fl (ref 78.0–100.0)
Monocytes Absolute: 0.5 10*3/uL (ref 0.1–1.0)
Monocytes Relative: 5.5 % (ref 3.0–12.0)
NEUTROS PCT: 71.3 % (ref 43.0–77.0)
Neutro Abs: 6.3 10*3/uL (ref 1.4–7.7)
PLATELETS: 267 10*3/uL (ref 150.0–400.0)
RBC: 4.88 Mil/uL (ref 3.87–5.11)
RDW: 14.9 % (ref 11.5–15.5)
WBC: 8.8 10*3/uL (ref 4.0–10.5)

## 2014-11-15 LAB — MICROALBUMIN / CREATININE URINE RATIO
Creatinine,U: 198.3 mg/dL
MICROALB UR: 0.8 mg/dL (ref 0.0–1.9)
Microalb Creat Ratio: 0.4 mg/g (ref 0.0–30.0)

## 2014-11-15 LAB — HEPATIC FUNCTION PANEL
ALBUMIN: 4.3 g/dL (ref 3.5–5.2)
ALT: 13 U/L (ref 0–35)
AST: 14 U/L (ref 0–37)
Alkaline Phosphatase: 70 U/L (ref 39–117)
BILIRUBIN DIRECT: 0.1 mg/dL (ref 0.0–0.3)
TOTAL PROTEIN: 7.8 g/dL (ref 6.0–8.3)
Total Bilirubin: 0.5 mg/dL (ref 0.2–1.2)

## 2014-11-15 LAB — POCT URINALYSIS DIPSTICK
BILIRUBIN UA: NEGATIVE
GLUCOSE UA: NEGATIVE
KETONES UA: NEGATIVE
Leukocytes, UA: NEGATIVE
NITRITE UA: NEGATIVE
Protein, UA: NEGATIVE
RBC UA: NEGATIVE
Spec Grav, UA: 1.025
UROBILINOGEN UA: NEGATIVE
pH, UA: 6

## 2014-11-15 LAB — BASIC METABOLIC PANEL
BUN: 16 mg/dL (ref 6–23)
CALCIUM: 9.6 mg/dL (ref 8.4–10.5)
CHLORIDE: 103 meq/L (ref 96–112)
CO2: 31 mEq/L (ref 19–32)
CREATININE: 0.84 mg/dL (ref 0.40–1.20)
GFR: 86.92 mL/min (ref >60.00)
Glucose, Bld: 88 mg/dL (ref 70–99)
POTASSIUM: 4.1 meq/L (ref 3.5–5.1)
SODIUM: 138 meq/L (ref 135–145)

## 2014-11-15 LAB — LIPID PANEL
Cholesterol: 224 mg/dL — ABNORMAL HIGH (ref 0–200)
HDL: 41.9 mg/dL (ref >39.00)
LDL Cholesterol: 164 mg/dL — ABNORMAL HIGH (ref 0–99)
NONHDL: 182.1
Total CHOL/HDL Ratio: 5
Triglycerides: 91 mg/dL (ref 0.0–149.0)
VLDL: 18.2 mg/dL (ref 0.0–40.0)

## 2014-11-15 MED ORDER — TETANUS-DIPHTH-ACELL PERTUSSIS 5-2.5-18.5 LF-MCG/0.5 IM SUSP: 0.5000 mL | Freq: Once | INTRAMUSCULAR | Status: DC

## 2014-11-15 MED ORDER — ZOSTER VACCINE LIVE 19400 UNT/0.65ML ~~LOC~~ SOLR: 0.6500 mL | Freq: Once | SUBCUTANEOUS | Status: DC

## 2014-11-15 NOTE — Progress Notes (Signed)
Subjective:   Michaela Martinez is a 69 y.o. female who presents for Medicare Annual (Subsequent) preventive examination.  Review of Systems:   Review of Systems  Constitutional: Negative for activity change, appetite change and fatigue.  HENT: Negative for hearing loss, congestion, tinnitus and ear discharge.   Eyes: Negative for visual disturbance (see optho q1y -- vision corrected to 20/20 with glasses).  Respiratory: Negative for cough, chest tightness and shortness of breath.   Cardiovascular: Negative for chest pain, palpitations and leg swelling.  Gastrointestinal: Negative for abdominal pain, diarrhea, constipation and abdominal distention.  Genitourinary: Negative for urgency, frequency, decreased urine volume and difficulty urinating.  Musculoskeletal: Negative for back pain, arthralgias and gait problem.  Skin: Negative for color change, pallor and rash.  Neurological: Negative for dizziness, light-headedness, numbness and headaches.  Hematological: Negative for adenopathy. Does not bruise/bleed easily.  Psychiatric/Behavioral: Negative for suicidal ideas, confusion, sleep disturbance, self-injury, dysphoric mood, decreased concentration and agitation.  Pt is able to read and write and can do all ADLs No risk for falling No abuse/ violence in home           Objective:     Vitals: BP 168/88 mmHg  Pulse 70  Temp(Src) 97.9 F (36.6 C) (Oral)  Ht 5\' 7"  (1.702 m)  Wt 261 lb 3.2 oz (118.48 kg)  BMI 40.90 kg/m2  SpO2 98% BP 168/88 mmHg  Pulse 70  Temp(Src) 97.9 F (36.6 C) (Oral)  Ht 5\' 7"  (1.702 m)  Wt 261 lb 3.2 oz (118.48 kg)  BMI 40.90 kg/m2  SpO2 98% General appearance: alert, cooperative, appears stated age and no distress Head: Normocephalic, without obvious abnormality, atraumatic Eyes: conjunctivae/corneas clear. PERRL, EOM's intact. Fundi benign. Ears: normal TM's and external ear canals both ears Nose: Nares normal. Septum midline. Mucosa normal. No  drainage or sinus tenderness. Throat: lips, mucosa, and tongue normal; teeth and gums normal Neck: no adenopathy, no carotid bruit, no JVD, supple, symmetrical, trachea midline and thyroid not enlarged, symmetric, no tenderness/mass/nodules Back: symmetric, no curvature. ROM normal. No CVA tenderness. Lungs: clear to auscultation bilaterally Breasts: normal appearance, no masses or tenderness Heart: S1, S2 normal Abdomen: soft, non-tender; bowel sounds normal; no masses,  no organomegaly Pelvic: not indicated; post-menopausal, no abnormal Pap smears in past Extremities: extremities normal, atraumatic, no cyanosis or edema Pulses: 2+ and symmetric Skin: Skin color, texture, turgor normal. No rashes or lesions Lymph nodes: Cervical, supraclavicular, and axillary nodes normal. Neurologic: Alert and oriented X 3, normal strength and tone. Normal symmetric reflexes. Normal coordination and gait Psych- no depression, no anxiety Tobacco History  Smoking status  . Never Smoker   Smokeless tobacco  . Never Used     Counseling given: Not Answered   Past Medical History  Diagnosis Date  . Menopausal syndrome   . Depression   . Hypertension    Past Surgical History  Procedure Laterality Date  . Inguinal hernia repair  2000  . Tubal ligation  1970   Family History  Problem Relation Age of Onset  . Diabetes    . Hypertension    . Liver disease    . Pancreatic cancer    . Alcohol abuse    . Depression    . Asthma Son   . Pancreatic cancer Mother   . Breast cancer Maternal Grandmother   . Cirrhosis Brother    History  Sexual Activity  . Sexual Activity: Not on file    Outpatient Encounter Prescriptions as of 11/15/2014  Medication Sig  . buPROPion (WELLBUTRIN XL) 150 MG 24 hr tablet take 3 tablets by mouth once daily  . Cholecalciferol (VITAMIN D) 2000 UNITS CAPS Take by mouth.  . losartan (COZAAR) 100 MG tablet take 1 tablet by mouth once daily  . lovastatin (MEVACOR) 40 MG  tablet Take 1 tablet (40 mg total) by mouth at bedtime.  . metoprolol (LOPRESSOR) 100 MG tablet Take 1 tablet (100 mg total) by mouth 2 (two) times daily.  . sertraline (ZOLOFT) 100 MG tablet Take 1 tablet (100 mg total) by mouth daily.  Marland Kitchen spironolactone (ALDACTONE) 50 MG tablet Take 1 tablet (50 mg total) by mouth daily. D/C PREVIOUS SCRIPTS FOR THIS MEDICATION  . traMADol (ULTRAM) 50 MG tablet take 1 tablet by mouth every 6 hours if needed for pain  . triamcinolone cream (KENALOG) 0.5 % Apply 1 application topically 3 (three) times daily.  . Tdap (BOOSTRIX) 5-2.5-18.5 LF-MCG/0.5 injection Inject 0.5 mLs into the muscle once.  . zoster vaccine live, PF, (ZOSTAVAX) 54098 UNT/0.65ML injection Inject 19,400 Units into the skin once.   No facility-administered encounter medications on file as of 11/15/2014.    Activities of Daily Living In your present state of health, do you have any difficulty performing the following activities: 07/12/2014 07/12/2014  Hearing? N N  Vision? N N  Difficulty concentrating or making decisions? N N  Walking or climbing stairs? N Y  Dressing or bathing? N N  Doing errands, shopping? N N    Patient Care Team: Rosalita Chessman, DO as PCP - General    Assessment:    CPE Exercise Activities and Dietary recommendations-- unable to exercise     Goals    None     Fall Risk Fall Risk  07/12/2014 07/12/2014 10/06/2012  Falls in the past year? No No No   Depression Screen PHQ 2/9 Scores 07/12/2014 07/12/2014 10/06/2012  PHQ - 2 Score 0 1 1     Cognitive Testing AAOx3, nad, no anxiety, depression  Immunization History  Administered Date(s) Administered  . Influenza,inj,Quad PF,36+ Mos 04/12/2013, 07/12/2014  . Pneumococcal Conjugate-13 07/12/2014  . Pneumococcal Polysaccharide-23 10/06/2012  . Td 06/09/2003   Screening Tests Health Maintenance  Topic Date Due  . ZOSTAVAX  08/28/2007  . COLONOSCOPY  01/04/2013  . TETANUS/TDAP  06/08/2013  . MAMMOGRAM   12/02/2014  . INFLUENZA VACCINE  01/07/2015  . DEXA SCAN  Completed  . PNA vac Low Risk Adult  Completed      Plan:    cpe During the course of the visit the patient was educated and counseled about the following appropriate screening and preventive services:   Vaccines to include Pneumoccal, Influenza, Hepatitis B, Td, Zostavax, HCV  Electrocardiogram  Cardiovascular Disease  Colorectal cancer screening  Bone density screening  Diabetes screening  Glaucoma screening  Mammography/PAP  Nutrition counseling   Patient Instructions (the written plan) was given to the patient.  1. Need for prophylactic vaccination with combined diphtheria-tetanus-pertussis (DTP) vaccine  - Tdap (BOOSTRIX) 5-2.5-18.5 LF-MCG/0.5 injection; Inject 0.5 mLs into the muscle once.  Dispense: 0.5 mL; Refill: 0  2. Need for shingles vaccine  - zoster vaccine live, PF, (ZOSTAVAX) 11914 UNT/0.65ML injection; Inject 19,400 Units into the skin once.  Dispense: 1 each; Refill: 0  3. Essential hypertension Stable-- take losartan  And metoprolol - Basic metabolic panel - CBC with Differential/Platelet - POCT urinalysis dipstick - Microalbumin / creatinine urine ratio  4. Hyperlipidemia Cont lovastatin and con't labs - Hepatic function  panel - Lipid panel - POCT urinalysis dipstick - Microalbumin / creatinine urine ratio  5. Estrogen deficiency   - DG Bone Density; Future  6. Breast cancer screening   - MM DIGITAL SCREENING BILATERAL; Future  Garnet Koyanagi, DO  11/15/2014

## 2014-11-15 NOTE — Patient Instructions (Signed)
Preventive Care for Adults A healthy lifestyle and preventive care can promote health and wellness. Preventive health guidelines for women include the following key practices.  A routine yearly physical is a good way to check with your health care provider about your health and preventive screening. It is a chance to share any concerns and updates on your health and to receive a thorough exam.  Visit your dentist for a routine exam and preventive care every 6 months. Brush your teeth twice a day and floss once a day. Good oral hygiene prevents tooth decay and gum disease.  The frequency of eye exams is based on your age, health, family medical history, use of contact lenses, and other factors. Follow your health care provider's recommendations for frequency of eye exams.  Eat a healthy diet. Foods like vegetables, fruits, whole grains, low-fat dairy products, and lean protein foods contain the nutrients you need without too many calories. Decrease your intake of foods high in solid fats, added sugars, and salt. Eat the right amount of calories for you.Get information about a proper diet from your health care provider, if necessary.  Regular physical exercise is one of the most important things you can do for your health. Most adults should get at least 150 minutes of moderate-intensity exercise (any activity that increases your heart rate and causes you to sweat) each week. In addition, most adults need muscle-strengthening exercises on 2 or more days a week.  Maintain a healthy weight. The body mass index (BMI) is a screening tool to identify possible weight problems. It provides an estimate of body fat based on height and weight. Your health care provider can find your BMI and can help you achieve or maintain a healthy weight.For adults 20 years and older:  A BMI below 18.5 is considered underweight.  A BMI of 18.5 to 24.9 is normal.  A BMI of 25 to 29.9 is considered overweight.  A BMI of  30 and above is considered obese.  Maintain normal blood lipids and cholesterol levels by exercising and minimizing your intake of saturated fat. Eat a balanced diet with plenty of fruit and vegetables. Blood tests for lipids and cholesterol should begin at age 76 and be repeated every 5 years. If your lipid or cholesterol levels are high, you are over 50, or you are at high risk for heart disease, you may need your cholesterol levels checked more frequently.Ongoing high lipid and cholesterol levels should be treated with medicines if diet and exercise are not working.  If you smoke, find out from your health care provider how to quit. If you do not use tobacco, do not start.  Lung cancer screening is recommended for adults aged 22-80 years who are at high risk for developing lung cancer because of a history of smoking. A yearly low-dose CT scan of the lungs is recommended for people who have at least a 30-pack-year history of smoking and are a current smoker or have quit within the past 15 years. A pack year of smoking is smoking an average of 1 pack of cigarettes a day for 1 year (for example: 1 pack a day for 30 years or 2 packs a day for 15 years). Yearly screening should continue until the smoker has stopped smoking for at least 15 years. Yearly screening should be stopped for people who develop a health problem that would prevent them from having lung cancer treatment.  If you are pregnant, do not drink alcohol. If you are breastfeeding,  be very cautious about drinking alcohol. If you are not pregnant and choose to drink alcohol, do not have more than 1 drink per day. One drink is considered to be 12 ounces (355 mL) of beer, 5 ounces (148 mL) of wine, or 1.5 ounces (44 mL) of liquor.  Avoid use of street drugs. Do not share needles with anyone. Ask for help if you need support or instructions about stopping the use of drugs.  High blood pressure causes heart disease and increases the risk of  stroke. Your blood pressure should be checked at least every 1 to 2 years. Ongoing high blood pressure should be treated with medicines if weight loss and exercise do not work.  If you are 3-86 years old, ask your health care provider if you should take aspirin to prevent strokes.  Diabetes screening involves taking a blood sample to check your fasting blood sugar level. This should be done once every 3 years, after age 67, if you are within normal weight and without risk factors for diabetes. Testing should be considered at a younger age or be carried out more frequently if you are overweight and have at least 1 risk factor for diabetes.  Breast cancer screening is essential preventive care for women. You should practice "breast self-awareness." This means understanding the normal appearance and feel of your breasts and may include breast self-examination. Any changes detected, no matter how small, should be reported to a health care provider. Women in their 8s and 30s should have a clinical breast exam (CBE) by a health care provider as part of a regular health exam every 1 to 3 years. After age 70, women should have a CBE every year. Starting at age 25, women should consider having a mammogram (breast X-ray test) every year. Women who have a family history of breast cancer should talk to their health care provider about genetic screening. Women at a high risk of breast cancer should talk to their health care providers about having an MRI and a mammogram every year.  Breast cancer gene (BRCA)-related cancer risk assessment is recommended for women who have family members with BRCA-related cancers. BRCA-related cancers include breast, ovarian, tubal, and peritoneal cancers. Having family members with these cancers may be associated with an increased risk for harmful changes (mutations) in the breast cancer genes BRCA1 and BRCA2. Results of the assessment will determine the need for genetic counseling and  BRCA1 and BRCA2 testing.  Routine pelvic exams to screen for cancer are no longer recommended for nonpregnant women who are considered low risk for cancer of the pelvic organs (ovaries, uterus, and vagina) and who do not have symptoms. Ask your health care provider if a screening pelvic exam is right for you.  If you have had past treatment for cervical cancer or a condition that could lead to cancer, you need Pap tests and screening for cancer for at least 20 years after your treatment. If Pap tests have been discontinued, your risk factors (such as having a new sexual partner) need to be reassessed to determine if screening should be resumed. Some women have medical problems that increase the chance of getting cervical cancer. In these cases, your health care provider may recommend more frequent screening and Pap tests.  The HPV test is an additional test that may be used for cervical cancer screening. The HPV test looks for the virus that can cause the cell changes on the cervix. The cells collected during the Pap test can be  tested for HPV. The HPV test could be used to screen women aged 7 years and older, and should be used in women of any age who have unclear Pap test results. After the age of 77, women should have HPV testing at the same frequency as a Pap test.  Colorectal cancer can be detected and often prevented. Most routine colorectal cancer screening begins at the age of 85 years and continues through age 43 years. However, your health care provider may recommend screening at an earlier age if you have risk factors for colon cancer. On a yearly basis, your health care provider may provide home test kits to check for hidden blood in the stool. Use of a small camera at the end of a tube, to directly examine the colon (sigmoidoscopy or colonoscopy), can detect the earliest forms of colorectal cancer. Talk to your health care provider about this at age 87, when routine screening begins. Direct  exam of the colon should be repeated every 5-10 years through age 74 years, unless early forms of pre-cancerous polyps or small growths are found.  People who are at an increased risk for hepatitis B should be screened for this virus. You are considered at high risk for hepatitis B if:  You were born in a country where hepatitis B occurs often. Talk with your health care provider about which countries are considered high risk.  Your parents were born in a high-risk country and you have not received a shot to protect against hepatitis B (hepatitis B vaccine).  You have HIV or AIDS.  You use needles to inject street drugs.  You live with, or have sex with, someone who has hepatitis B.  You get hemodialysis treatment.  You take certain medicines for conditions like cancer, organ transplantation, and autoimmune conditions.  Hepatitis C blood testing is recommended for all people born from [birth year] through 1965 and any individual with known risks for hepatitis C.  Practice safe sex. Use condoms and avoid high-risk sexual practices to reduce the spread of sexually transmitted infections (STIs). STIs include gonorrhea, chlamydia, syphilis, trichomonas, herpes, HPV, and human immunodeficiency virus (HIV). Herpes, HIV, and HPV are viral illnesses that have no cure. They can result in disability, cancer, and death.  You should be screened for sexually transmitted illnesses (STIs) including gonorrhea and chlamydia if:  You are sexually active and are younger than 24 years.  You are older than 24 years and your health care provider tells you that you are at risk for this type of infection.  Your sexual activity has changed since you were last screened and you are at an increased risk for chlamydia or gonorrhea. Ask your health care provider if you are at risk.  If you are at risk of being infected with HIV, it is recommended that you take a prescription medicine daily to prevent HIV infection. This is  called preexposure prophylaxis (PrEP). You are considered at risk if:  You are a heterosexual woman, are sexually active, and are at increased risk for HIV infection.  You take drugs by injection.  You are sexually active with a partner who has HIV.  Talk with your health care provider about whether you are at high risk of being infected with HIV. If you choose to begin PrEP, you should first be tested for HIV. You should then be tested every 3 months for as long as you are taking PrEP.  Osteoporosis is a disease in which the bones lose minerals and strength  with aging. This can result in serious bone fractures or breaks. The risk of osteoporosis can be identified using a bone density scan. Women ages 46 years and over and women at risk for fractures or osteoporosis should discuss screening with their health care providers. Ask your health care provider whether you should take a calcium supplement or vitamin D to reduce the rate of osteoporosis.  Menopause can be associated with physical symptoms and risks. Hormone replacement therapy is available to decrease symptoms and risks. You should talk to your health care provider about whether hormone replacement therapy is right for you.  Use sunscreen. Apply sunscreen liberally and repeatedly throughout the day. You should seek shade when your shadow is shorter than you. Protect yourself by wearing long sleeves, pants, a wide-brimmed hat, and sunglasses year round, whenever you are outdoors.  Once a month, do a whole body skin exam, using a mirror to look at the skin on your back. Tell your health care provider of new moles, moles that have irregular borders, moles that are larger than a pencil eraser, or moles that have changed in shape or color.  Stay current with required vaccines (immunizations).  Influenza vaccine. All adults should be immunized every year.  Tetanus, diphtheria, and acellular pertussis (Td, Tdap) vaccine. Pregnant women should  receive 1 dose of Tdap vaccine during each pregnancy. The dose should be obtained regardless of the length of time since the last dose. Immunization is preferred during the 27th-36th week of gestation. An adult who has not previously received Tdap or who does not know her vaccine status should receive 1 dose of Tdap. This initial dose should be followed by tetanus and diphtheria toxoids (Td) booster doses every 10 years. Adults with an unknown or incomplete history of completing a 3-dose immunization series with Td-containing vaccines should begin or complete a primary immunization series including a Tdap dose. Adults should receive a Td booster every 10 years.  Varicella vaccine. An adult without evidence of immunity to varicella should receive 2 doses or a second dose if she has previously received 1 dose. Pregnant females who do not have evidence of immunity should receive the first dose after pregnancy. This first dose should be obtained before leaving the health care facility. The second dose should be obtained 4-8 weeks after the first dose.  Human papillomavirus (HPV) vaccine. Females aged 13-26 years who have not received the vaccine previously should obtain the 3-dose series. The vaccine is not recommended for use in pregnant females. However, pregnancy testing is not needed before receiving a dose. If a female is found to be pregnant after receiving a dose, no treatment is needed. In that case, the remaining doses should be delayed until after the pregnancy. Immunization is recommended for any person with an immunocompromised condition through the age of 34 years if she did not get any or all doses earlier. During the 3-dose series, the second dose should be obtained 4-8 weeks after the first dose. The third dose should be obtained 24 weeks after the first dose and 16 weeks after the second dose.  Zoster vaccine. One dose is recommended for adults aged 63 years or older unless certain conditions are  present.  Measles, mumps, and rubella (MMR) vaccine. Adults born before 35 generally are considered immune to measles and mumps. Adults born in [birth year] or later should have 1 or more doses of MMR vaccine unless there is a contraindication to the vaccine or there is laboratory evidence of immunity to  each of the three diseases. A routine second dose of MMR vaccine should be obtained at least 28 days after the first dose for students attending postsecondary schools, health care workers, or international travelers. People who received inactivated measles vaccine or an unknown type of measles vaccine during 1963-1967 should receive 2 doses of MMR vaccine. People who received inactivated mumps vaccine or an unknown type of mumps vaccine before 1979 and are at high risk for mumps infection should consider immunization with 2 doses of MMR vaccine. For females of childbearing age, rubella immunity should be determined. If there is no evidence of immunity, females who are not pregnant should be vaccinated. If there is no evidence of immunity, females who are pregnant should delay immunization until after pregnancy. Unvaccinated health care workers born before 1957 who lack laboratory evidence of measles, mumps, or rubella immunity or laboratory confirmation of disease should consider measles and mumps immunization with 2 doses of MMR vaccine or rubella immunization with 1 dose of MMR vaccine.  Pneumococcal 13-valent conjugate (PCV13) vaccine. When indicated, a person who is uncertain of her immunization history and has no record of immunization should receive the PCV13 vaccine. An adult aged 19 years or older who has certain medical conditions and has not been previously immunized should receive 1 dose of PCV13 vaccine. This PCV13 should be followed with a dose of pneumococcal polysaccharide (PPSV23) vaccine. The PPSV23 vaccine dose should be obtained at least 8 weeks after the dose of PCV13 vaccine. An adult aged 19  years or older who has certain medical conditions and previously received 1 or more doses of PPSV23 vaccine should receive 1 dose of PCV13. The PCV13 vaccine dose should be obtained 1 or more years after the last PPSV23 vaccine dose.  Pneumococcal polysaccharide (PPSV23) vaccine. When PCV13 is also indicated, PCV13 should be obtained first. All adults aged 65 years and older should be immunized. An adult younger than age 65 years who has certain medical conditions should be immunized. Any person who resides in a nursing home or long-term care facility should be immunized. An adult smoker should be immunized. People with an immunocompromised condition and certain other conditions should receive both PCV13 and PPSV23 vaccines. People with human immunodeficiency virus (HIV) infection should be immunized as soon as possible after diagnosis. Immunization during chemotherapy or radiation therapy should be avoided. Routine use of PPSV23 vaccine is not recommended for American Indians, [place] Natives, or people younger than 65 years unless there are medical conditions that require PPSV23 vaccine. When indicated, people who have unknown immunization and have no record of immunization should receive PPSV23 vaccine. One-time revaccination 5 years after the first dose of PPSV23 is recommended for people aged 19-64 years who have chronic kidney failure, nephrotic syndrome, asplenia, or immunocompromised conditions. People who received 1-2 doses of PPSV23 before age 65 years should receive another dose of PPSV23 vaccine at age 65 years or later if at least 5 years have passed since the previous dose. Doses of PPSV23 are not needed for people immunized with PPSV23 at or after age 65 years.  Meningococcal vaccine. Adults with asplenia or persistent complement component deficiencies should receive 2 doses of quadrivalent meningococcal conjugate (MenACWY-D) vaccine. The doses should be obtained at least 2 months apart.  Microbiologists working with certain meningococcal bacteria, military recruits, people at risk during an outbreak, and people who travel to or live in countries with a high rate of meningitis should be immunized. A first-year college student up through age   21 years who is living in a residence hall should receive a dose if she did not receive a dose on or after her 16th birthday. Adults who have certain high-risk conditions should receive one or more doses of vaccine.  Hepatitis A vaccine. Adults who wish to be protected from this disease, have certain high-risk conditions, work with hepatitis A-infected animals, work in hepatitis A research labs, or travel to or work in countries with a high rate of hepatitis A should be immunized. Adults who were previously unvaccinated and who anticipate close contact with an international adoptee during the first 60 days after arrival in the [place] from a country with a high rate of hepatitis A should be immunized.  Hepatitis B vaccine. Adults who wish to be protected from this disease, have certain high-risk conditions, may be exposed to blood or other infectious body fluids, are household contacts or sex partners of hepatitis B positive people, are clients or workers in certain care facilities, or travel to or work in countries with a high rate of hepatitis B should be immunized.  Haemophilus influenzae type b (Hib) vaccine. A previously unvaccinated person with asplenia or sickle cell disease or having a scheduled splenectomy should receive 1 dose of Hib vaccine. Regardless of previous immunization, a recipient of a hematopoietic stem cell transplant should receive a 3-dose series 6-12 months after her successful transplant. Hib vaccine is not recommended for adults with HIV infection. Preventive Services / Frequency Ages 12 to 51 years  Blood pressure check.** / Every 1 to 2 years.  Lipid and cholesterol check.** / Every 5 years beginning at age  42.  Clinical breast exam.** / Every 3 years for women in their 45s and 37s.  BRCA-related cancer risk assessment.** / For women who have family members with a BRCA-related cancer (breast, ovarian, tubal, or peritoneal cancers).  Pap test.** / Every 2 years from ages 62 through 69. Every 3 years starting at age 62 through age 69 or 6 with a history of 3 consecutive normal Pap tests.  HPV screening.** / Every 3 years from ages 69 through ages 23 to 33 with a history of 3 consecutive normal Pap tests.  Hepatitis C blood test.** / For any individual with known risks for hepatitis C.  Skin self-exam. / Monthly.  Influenza vaccine. / Every year.  Tetanus, diphtheria, and acellular pertussis (Tdap, Td) vaccine.** / Consult your health care provider. Pregnant women should receive 1 dose of Tdap vaccine during each pregnancy. 1 dose of Td every 10 years.  Varicella vaccine.** / Consult your health care provider. Pregnant females who do not have evidence of immunity should receive the first dose after pregnancy.  HPV vaccine. / 3 doses over 6 months, if 55 and younger. The vaccine is not recommended for use in pregnant females. However, pregnancy testing is not needed before receiving a dose.  Measles, mumps, rubella (MMR) vaccine.** / You need at least 1 dose of MMR if you were born in 1957 or later. You may also need a 2nd dose. For females of childbearing age, rubella immunity should be determined. If there is no evidence of immunity, females who are not pregnant should be vaccinated. If there is no evidence of immunity, females who are pregnant should delay immunization until after pregnancy.  Pneumococcal 13-valent conjugate (PCV13) vaccine.** / Consult your health care provider.  Pneumococcal polysaccharide (PPSV23) vaccine.** / 1 to 2 doses if you smoke cigarettes or if you have certain conditions.  Meningococcal vaccine.** /  1 dose if you are age 19 to 21 years and a first-year college  student living in a residence hall, or have one of several medical conditions, you need to get vaccinated against meningococcal disease. You may also need additional booster doses.  Hepatitis A vaccine.** / Consult your health care provider.  Hepatitis B vaccine.** / Consult your health care provider.  Haemophilus influenzae type b (Hib) vaccine.** / Consult your health care provider. Ages 40 to 64 years  Blood pressure check.** / Every 1 to 2 years.  Lipid and cholesterol check.** / Every 5 years beginning at age 20 years.  Lung cancer screening. / Every year if you are aged 55-80 years and have a 30-pack-year history of smoking and currently smoke or have quit within the past 15 years. Yearly screening is stopped once you have quit smoking for at least 15 years or develop a health problem that would prevent you from having lung cancer treatment.  Clinical breast exam.** / Every year after age 40 years.  BRCA-related cancer risk assessment.** / For women who have family members with a BRCA-related cancer (breast, ovarian, tubal, or peritoneal cancers).  Mammogram.** / Every year beginning at age 40 years and continuing for as long as you are in good health. Consult with your health care provider.  Pap test.** / Every 3 years starting at age 30 years through age 65 or 70 years with a history of 3 consecutive normal Pap tests.  HPV screening.** / Every 3 years from ages 30 years through ages 65 to 70 years with a history of 3 consecutive normal Pap tests.  Fecal occult blood test (FOBT) of stool. / Every year beginning at age 50 years and continuing until age 75 years. You may not need to do this test if you get a colonoscopy every 10 years.  Flexible sigmoidoscopy or colonoscopy.** / Every 5 years for a flexible sigmoidoscopy or every 10 years for a colonoscopy beginning at age 50 years and continuing until age 75 years.  Hepatitis C blood test.** / For all people born from 1943 through  1965 and any individual with known risks for hepatitis C.  Skin self-exam. / Monthly.  Influenza vaccine. / Every year.  Tetanus, diphtheria, and acellular pertussis (Tdap/Td) vaccine.** / Consult your health care provider. Pregnant women should receive 1 dose of Tdap vaccine during each pregnancy. 1 dose of Td every 10 years.  Varicella vaccine.** / Consult your health care provider. Pregnant females who do not have evidence of immunity should receive the first dose after pregnancy.  Zoster vaccine.** / 1 dose for adults aged 60 years or older.  Measles, mumps, rubella (MMR) vaccine.** / You need at least 1 dose of MMR if you were born in 1957 or later. You may also need a 2nd dose. For females of childbearing age, rubella immunity should be determined. If there is no evidence of immunity, females who are not pregnant should be vaccinated. If there is no evidence of immunity, females who are pregnant should delay immunization until after pregnancy.  Pneumococcal 13-valent conjugate (PCV13) vaccine.** / Consult your health care provider.  Pneumococcal polysaccharide (PPSV23) vaccine.** / 1 to 2 doses if you smoke cigarettes or if you have certain conditions.  Meningococcal vaccine.** / Consult your health care provider.  Hepatitis A vaccine.** / Consult your health care provider.  Hepatitis B vaccine.** / Consult your health care provider.  Haemophilus influenzae type b (Hib) vaccine.** / Consult your health care provider. Ages 65   years and over  Blood pressure check.** / Every 1 to 2 years.  Lipid and cholesterol check.** / Every 5 years beginning at age 22 years.  Lung cancer screening. / Every year if you are aged 73-80 years and have a 30-pack-year history of smoking and currently smoke or have quit within the past 15 years. Yearly screening is stopped once you have quit smoking for at least 15 years or develop a health problem that would prevent you from having lung cancer  treatment.  Clinical breast exam.** / Every year after age 4 years.  BRCA-related cancer risk assessment.** / For women who have family members with a BRCA-related cancer (breast, ovarian, tubal, or peritoneal cancers).  Mammogram.** / Every year beginning at age 40 years and continuing for as long as you are in good health. Consult with your health care provider.  Pap test.** / Every 3 years starting at age 9 years through age 34 or 91 years with 3 consecutive normal Pap tests. Testing can be stopped between 65 and 70 years with 3 consecutive normal Pap tests and no abnormal Pap or HPV tests in the past 10 years.  HPV screening.** / Every 3 years from ages 57 years through ages 64 or 45 years with a history of 3 consecutive normal Pap tests. Testing can be stopped between 65 and 70 years with 3 consecutive normal Pap tests and no abnormal Pap or HPV tests in the past 10 years.  Fecal occult blood test (FOBT) of stool. / Every year beginning at age 15 years and continuing until age 17 years. You may not need to do this test if you get a colonoscopy every 10 years.  Flexible sigmoidoscopy or colonoscopy.** / Every 5 years for a flexible sigmoidoscopy or every 10 years for a colonoscopy beginning at age 86 years and continuing until age 71 years.  Hepatitis C blood test.** / For all people born from [birth year] through 1963 and any individual with known risks for hepatitis C.  Osteoporosis screening.** / A one-time screening for women ages 83 years and over and women at risk for fractures or osteoporosis.  Skin self-exam. / Monthly.  Influenza vaccine. / Every year.  Tetanus, diphtheria, and acellular pertussis (Tdap/Td) vaccine.** / 1 dose of Td every 10 years.  Varicella vaccine.** / Consult your health care provider.  Zoster vaccine.** / 1 dose for adults aged 61 years or older.  Pneumococcal 13-valent conjugate (PCV13) vaccine.** / Consult your health care provider.  Pneumococcal  polysaccharide (PPSV23) vaccine.** / 1 dose for all adults aged 28 years and older.  Meningococcal vaccine.** / Consult your health care provider.  Hepatitis A vaccine.** / Consult your health care provider.  Hepatitis B vaccine.** / Consult your health care provider.  Haemophilus influenzae type b (Hib) vaccine.** / Consult your health care provider. ** Family history and personal history of risk and conditions may change your health care provider's recommendations. Document Released: 07/21/2001 Document Revised: 10/09/2013 Document Reviewed: 10/20/2010 [facility] Patient Information 2015 [place], [place]. This information is not intended to replace advice given to you by your health care provider. Make sure you discuss any questions you have with your health care provider.

## 2014-11-15 NOTE — Progress Notes (Signed)
Pre visit review using our clinic review tool, if applicable. No additional management support is needed unless otherwise documented below in the visit note. 

## 2014-11-19 ENCOUNTER — Telehealth: Payer: Self-pay | Admitting: *Deleted

## 2014-11-19 DIAGNOSIS — I10 Essential (primary) hypertension: Secondary | ICD-10-CM

## 2014-11-19 DIAGNOSIS — E785 Hyperlipidemia, unspecified: Secondary | ICD-10-CM

## 2014-11-19 MED ORDER — SIMVASTATIN 40 MG PO TABS: 40.0000 mg | ORAL_TABLET | Freq: Every day | ORAL | Status: DC

## 2014-11-19 NOTE — Telephone Encounter (Signed)
Notified pt of below result and she is agreeable to try zocor. Rx sent. Future lab order scheduled for 02/19/15 at 8:30am.

## 2014-11-19 NOTE — Telephone Encounter (Signed)
-----   Message from Rosalita Chessman, DO sent at 11/17/2014  6:13 PM EDT ----- Cholesterol--- LDL goal < 100,  HDL >40,  TG < 150.  Diet and exercise will increase HDL and decrease LDL and TG.  Fish,  Fish Oil, Flaxseed oil will also help increase the HDL and decrease Triglycerides.   Recheck labs in 3 months---we really need to change med to something stronger-----.would like to try zocor 40 mg #30  1 po qhs, 2 refills          Lipid, hep, bmp

## 2014-11-19 NOTE — Addendum Note (Signed)
Addended by: Kelle Darting A on: 11/19/2014 02:32 PM   Modules accepted: Orders

## 2015-01-15 IMAGING — MG MM DIGITAL SCREENING BILAT
4 series · 4 of 4 positions shown · non-contrast
Comparison: None.
COMPARISON: None

***ADDENDUM*** CREATED: 12/01/2012 [DATE]

The patient's bone densitometry exam performed on the same date was
inadvertently dictated under the mammography report.  This is the
correct report corresponding to the patient's mammogram today.
CLINICAL DATA: Screening.
DIGITAL SCREENING BILATERAL MAMMOGRAM WITH CAD
CLINICAL DATA: History of estrogen deficiency.  Postmenopausal.
The patient takes Zoloft and Wellbutrin.  Vitamin D
supplementation.

[R CC]
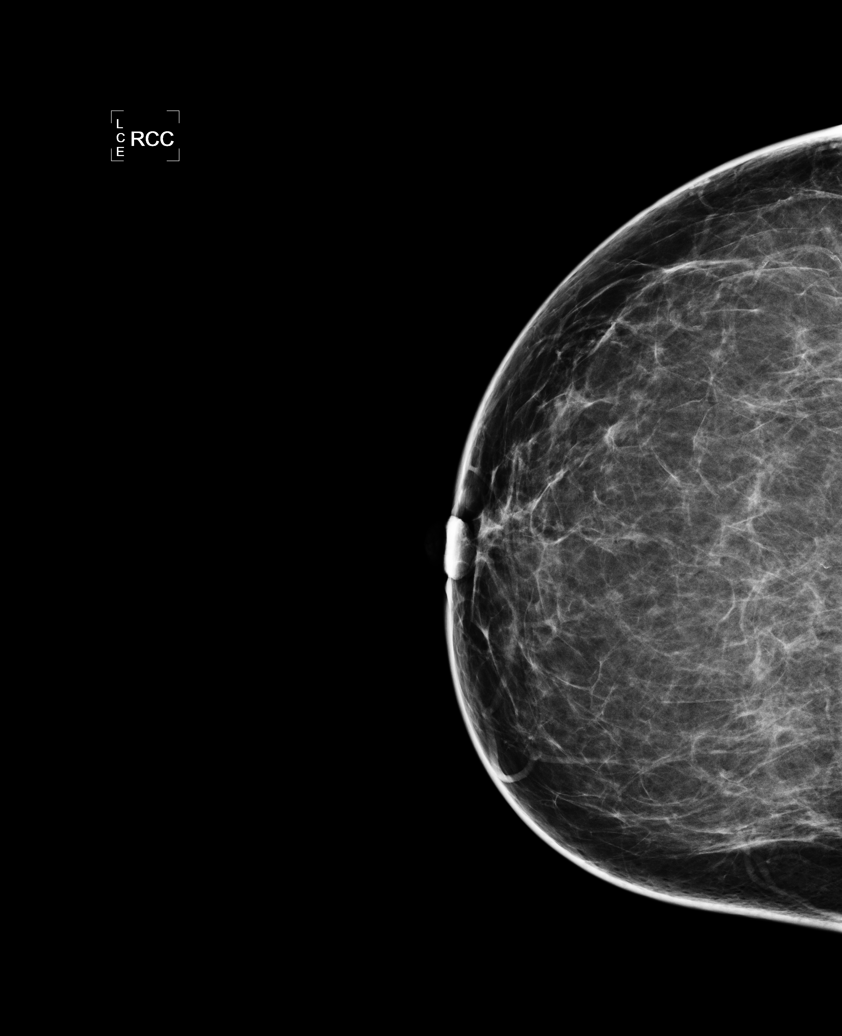

[L CC]
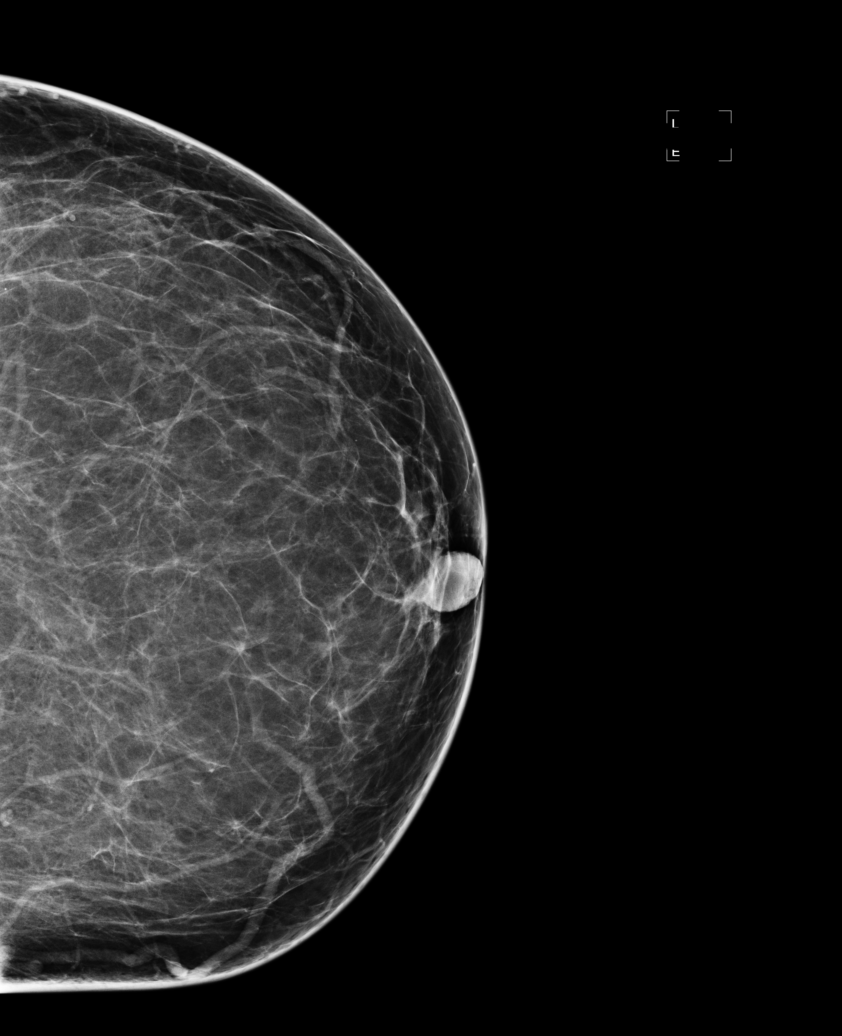

[L MLO]
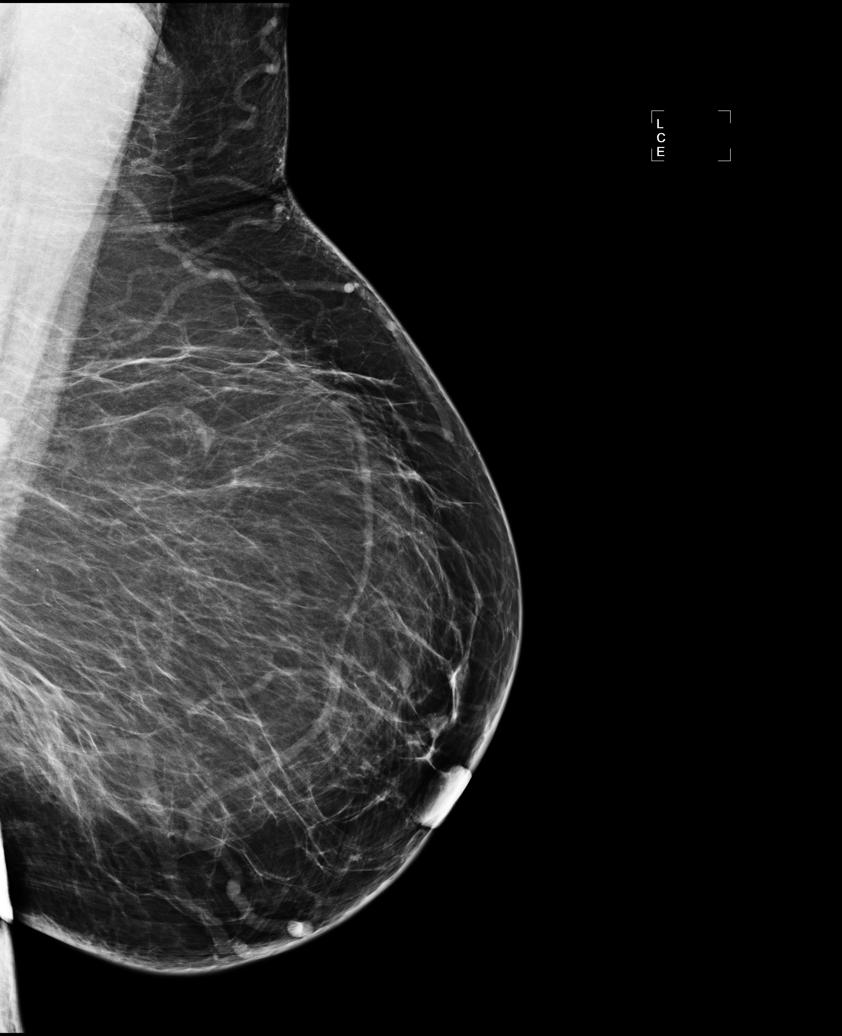

[R MLO]
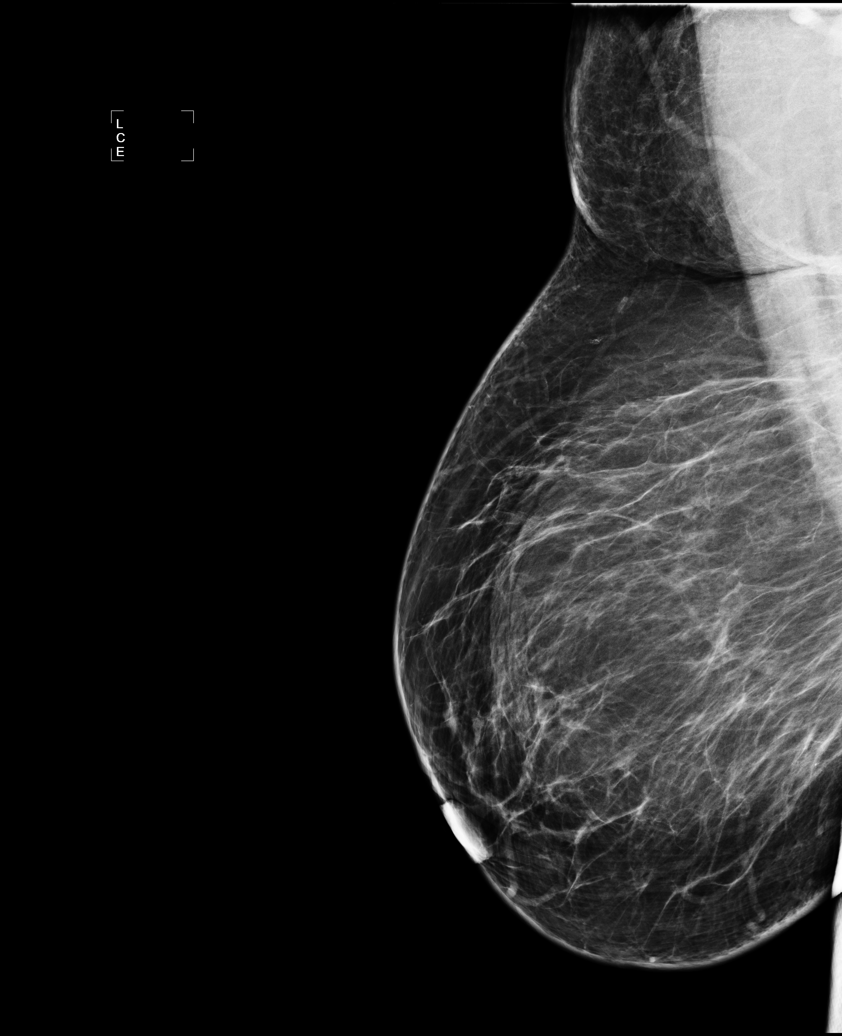

[4 of 4 positions shown; findings below may reference images not displayed]

FINDINGS: ACR Breast Density Category b:  There are scattered areas of
fibroglandular density.

There are no findings suspicious for malignancy.

Images were processed with CAD.
IMPRESSION: No mammographic evidence of malignancy.

A result letter of this screening mammogram will be mailed directly
to the patient.

RECOMMENDATION:
Screening mammogram in one year. (Code:[id number])

BI-RADS CATEGORY 1:  Negative.
DUAL X-RAY ABSORPTIOMETRY (DXA) FOR BONE MINERAL DENSITY

AP LUMBAR SPINE EXCLUDED BECAUSE OF DEGENERATIVE CHANGES

LEFT FEMUR NECK

Bone Mineral Density (BMD):             0.874 g/cm2
Young Adult T Score:
Z Score:

LEFT FOREARM ([DATE] RADIUS)

Bone Mineral Density (BMD):                     0.798 g/cm2
Young Adult T Score:
Z Score:

ASSESSMENT:  Patient's diagnostic category is NORMAL by WHO
Criteria.

FRACTURE RISK: Not increased

FRAX: World Health Organization FRAX assessment of absolute
fracture risk is not calculated for this patient because the
patient has T scores in the normal range.
RECOMMENDATIONS:

All patients should ensure an adequate intake of dietary calcium
(1200mg daily) and vitamin D (800 Helina Seng) unless contraindicated.
The National Osteoporosis Foundation recommends that FDA-approved
medical therapies be considered in postmenopausal women and mean
age 50 or older with a:

1)    Hip or vertebral (clinical or morphometric) fracture.

2)   T-score of -2.5 or lower at the spine or hip.

3)   Ten-year fracture probability by FRAX of 3% or greater for hip
fracture or 20% or greater for major osteoporotic fracture.

FOLLOW-UP:

People with diagnosed cases of osteoporosis or at high risk for
fracture should have regular bone mineral density tests.  For
patients eligible for Medicare, routine testing is allowed once
every 2 years.  The testing frequency can be increased to one year
for patients who have rapidly progressing disease, those who are
receiving or discontinuing medical therapy to restore bone mass, or
have additional risk factors.

## 2015-02-19 ENCOUNTER — Other Ambulatory Visit: Payer: Medicare PPO

## 2015-05-10 ENCOUNTER — Other Ambulatory Visit: Payer: Self-pay | Admitting: Family Medicine

## 2015-05-10 NOTE — Telephone Encounter (Signed)
Requesting Tramadol 50mg -Take 1 tablet by mouth every 6 hours as needed for pain. Last refill:07/12/14;#60,2 Last OV:11/15/14 Please advise.//AB/CMA

## 2015-05-13 NOTE — Telephone Encounter (Signed)
Faxed hardcopy for tramadol to [facility] on [address] in [place]

## 2015-07-11 ENCOUNTER — Other Ambulatory Visit: Payer: Self-pay | Admitting: Family Medicine

## 2015-07-12 NOTE — Telephone Encounter (Signed)
Left message for patient to call the office to schedule Follow Up with Dr. Etter Sjogren

## 2015-07-12 NOTE — Telephone Encounter (Signed)
Please schedule this patient an apt.      KP 

## 2015-07-22 ENCOUNTER — Ambulatory Visit: Payer: Self-pay | Admitting: Family Medicine

## 2015-07-22 ENCOUNTER — Ambulatory Visit (INDEPENDENT_AMBULATORY_CARE_PROVIDER_SITE_OTHER): Payer: Medicare Other | Admitting: Medical

## 2015-07-22 ENCOUNTER — Encounter: Payer: Self-pay | Admitting: Medical

## 2015-07-22 VITALS — BP 178/90 | HR 73 | Temp 98.0°F | Ht 67.0 in | Wt 257.0 lb

## 2015-07-22 DIAGNOSIS — J209 Acute bronchitis, unspecified: Secondary | ICD-10-CM

## 2015-07-22 DIAGNOSIS — R062 Wheezing: Secondary | ICD-10-CM

## 2015-07-22 DIAGNOSIS — I10 Essential (primary) hypertension: Secondary | ICD-10-CM | POA: Diagnosis not present

## 2015-07-22 DIAGNOSIS — J309 Allergic rhinitis, unspecified: Secondary | ICD-10-CM | POA: Diagnosis not present

## 2015-07-22 MED ORDER — AZITHROMYCIN 250 MG PO TABS: ORAL_TABLET | ORAL | Status: DC

## 2015-07-22 MED ORDER — ALBUTEROL SULFATE HFA 108 (90 BASE) MCG/ACT IN AERS: 2.0000 | INHALATION_SPRAY | Freq: Four times a day (QID) | RESPIRATORY_TRACT | Status: DC | PRN

## 2015-07-22 MED ORDER — BECLOMETHASONE DIPROPIONATE 40 MCG/ACT IN AERS: 2.0000 | INHALATION_SPRAY | Freq: Two times a day (BID) | RESPIRATORY_TRACT | Status: DC

## 2015-07-22 MED ORDER — FLUTICASONE PROPIONATE 50 MCG/ACT NA SUSP: 2.0000 | Freq: Every day | NASAL | Status: DC

## 2015-07-22 MED ORDER — BENZONATATE 200 MG PO CAPS: 200.0000 mg | ORAL_CAPSULE | Freq: Three times a day (TID) | ORAL | Status: DC | PRN

## 2015-07-22 MED FILL — QVAR 40 MCG ORAL INHALER: 40 | 30 days supply | Qty: 9 | Fill #0

## 2015-07-22 MED FILL — PROAIR HFA 90 MCG INHALER: 108 (90 BAS | 30 days supply | Qty: 9 | Fill #0

## 2015-07-22 MED FILL — AZITHROMYCIN 250 MG TABLET: 250 | 5 days supply | Qty: 6 | Fill #0

## 2015-07-22 MED FILL — BENZONATATE 200 MG CAPSULE: 200 | 10 days supply | Qty: 30 | Fill #0

## 2015-07-22 MED FILL — FLUTICASONE PROP 50 MCG SPR: 50 | 30 days supply | Qty: 16 | Fill #0 | Status: TO

## 2015-07-22 NOTE — Patient Instructions (Signed)
For bronchitis rx azithromycin. For cough benzonatate rx. For nasal congestion flonase. For wheezing qvar inhaler and abluterol inhaler.  Overall sounds like you illness may have started out as allergic. So in about 7-10 after your illness has resolved would recommend continuing flonase and could add claritin otc(Make sure no d forumulation).  For you blood pressure please restart all your bp meds.  Follow up in 7 days or as needed
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
## 2015-07-22 NOTE — Progress Notes (Signed)
Pre visit review using our clinic review tool, if applicable. No additional management support is needed unless otherwise documented below in the visit note. 

## 2015-08-05 ENCOUNTER — Other Ambulatory Visit: Payer: Self-pay | Admitting: Family Medicine

## 2015-08-05 NOTE — Telephone Encounter (Signed)
Patient scheduled for 08/15/2015

## 2015-08-05 NOTE — Telephone Encounter (Signed)
Please schedule an apt for a follow up/Fasting.    KP

## 2015-08-12 ENCOUNTER — Other Ambulatory Visit: Payer: Self-pay

## 2015-08-12 DIAGNOSIS — Z1231 Encounter for screening mammogram for malignant neoplasm of breast: Secondary | ICD-10-CM

## 2015-08-15 ENCOUNTER — Encounter: Payer: Self-pay | Admitting: Family Medicine

## 2015-08-15 ENCOUNTER — Ambulatory Visit (INDEPENDENT_AMBULATORY_CARE_PROVIDER_SITE_OTHER): Payer: Medicare Other | Admitting: Family Medicine

## 2015-08-15 VITALS — BP 143/82 | HR 67 | Temp 98.1°F | Ht 67.0 in | Wt 256.8 lb

## 2015-08-15 DIAGNOSIS — Z0000 Encounter for general adult medical examination without abnormal findings: Secondary | ICD-10-CM

## 2015-08-15 DIAGNOSIS — Z23 Encounter for immunization: Secondary | ICD-10-CM

## 2015-08-15 DIAGNOSIS — I10 Essential (primary) hypertension: Secondary | ICD-10-CM | POA: Diagnosis not present

## 2015-08-15 DIAGNOSIS — E785 Hyperlipidemia, unspecified: Secondary | ICD-10-CM | POA: Diagnosis not present

## 2015-08-15 DIAGNOSIS — Z1159 Encounter for screening for other viral diseases: Secondary | ICD-10-CM | POA: Diagnosis not present

## 2015-08-15 LAB — LIPID PANEL
Cholesterol: 210 mg/dL — ABNORMAL HIGH (ref 0–200)
HDL: 43.3 mg/dL (ref >39.00)
LDL Cholesterol: 147 mg/dL — ABNORMAL HIGH (ref 0–99)
NONHDL: 166.9
Total CHOL/HDL Ratio: 5
Triglycerides: 100 mg/dL (ref 0.0–149.0)
VLDL: 20 mg/dL (ref 0.0–40.0)

## 2015-08-15 LAB — POCT URINALYSIS DIPSTICK
Bilirubin, UA: NEGATIVE
Blood, UA: NEGATIVE
Glucose, UA: NEGATIVE
Ketones, UA: NEGATIVE
LEUKOCYTES UA: NEGATIVE
NITRITE UA: NEGATIVE
PH UA: 6
PROTEIN UA: NEGATIVE
Spec Grav, UA: >=1.03
UROBILINOGEN UA: 0.2

## 2015-08-15 LAB — COMPREHENSIVE METABOLIC PANEL
ALT: 15 U/L (ref 0–35)
AST: 15 U/L (ref 0–37)
Albumin: 4.2 g/dL (ref 3.5–5.2)
Alkaline Phosphatase: 62 U/L (ref 39–117)
BILIRUBIN TOTAL: 0.4 mg/dL (ref 0.2–1.2)
BUN: 12 mg/dL (ref 6–23)
CHLORIDE: 105 meq/L (ref 96–112)
CO2: 31 meq/L (ref 19–32)
CREATININE: 0.88 mg/dL (ref 0.40–1.20)
Calcium: 9.4 mg/dL (ref 8.4–10.5)
GFR: 82.19 mL/min (ref >60.00)
GLUCOSE: 76 mg/dL (ref 70–99)
Potassium: 4.3 mEq/L (ref 3.5–5.1)
SODIUM: 142 meq/L (ref 135–145)
Total Protein: 7.5 g/dL (ref 6.0–8.3)

## 2015-08-15 MED ORDER — ZOSTER VACCINE LIVE 19400 UNT/0.65ML ~~LOC~~ SOLR: 0.6500 mL | Freq: Once | SUBCUTANEOUS | Status: DC

## 2015-08-15 MED ORDER — TETANUS-DIPHTH-ACELL PERTUSSIS 5-2.5-18.5 LF-MCG/0.5 IM SUSP: 0.5000 mL | Freq: Once | INTRAMUSCULAR | Status: DC

## 2015-08-15 MED ORDER — LOSARTAN POTASSIUM 50 MG PO TABS: 50.0000 mg | ORAL_TABLET | Freq: Every day | ORAL | Status: DC

## 2015-08-15 NOTE — Patient Instructions (Signed)
Hypertension Hypertension, commonly called high blood pressure, is when the force of blood pumping through your arteries is too strong. Your arteries are the blood vessels that carry blood from your heart throughout your body. A blood pressure reading consists of a higher number over a lower number, such as 110/72. The higher number (systolic) is the pressure inside your arteries when your heart pumps. The lower number (diastolic) is the pressure inside your arteries when your heart relaxes. Ideally you want your blood pressure below 120/80. Hypertension forces your heart to work harder to pump blood. Your arteries may become narrow or stiff. Having untreated or uncontrolled hypertension can cause heart attack, stroke, kidney disease, and other problems. RISK FACTORS Some risk factors for high blood pressure are controllable. Others are not.  Risk factors you cannot control include:   Race. You may be at higher risk if you are African American.  Age. Risk increases with age.  Gender. Men are at higher risk than women before age 45 years. After age 65, women are at higher risk than men. Risk factors you can control include:  Not getting enough exercise or physical activity.  Being overweight.  Getting too much fat, sugar, calories, or salt in your diet.  Drinking too much alcohol. SIGNS AND SYMPTOMS Hypertension does not usually cause signs or symptoms. Extremely high blood pressure (hypertensive crisis) may cause headache, anxiety, shortness of breath, and nosebleed. DIAGNOSIS To check if you have hypertension, your health care provider will measure your blood pressure while you are seated, with your arm held at the level of your heart. It should be measured at least twice using the same arm. Certain conditions can cause a difference in blood pressure between your right and left arms. A blood pressure reading that is higher than normal on one occasion does not mean that you need treatment. If  it is not clear whether you have high blood pressure, you may be asked to return on a different day to have your blood pressure checked again. Or, you may be asked to monitor your blood pressure at home for 1 or more weeks. TREATMENT Treating high blood pressure includes making lifestyle changes and possibly taking medicine. Living a healthy lifestyle can help lower high blood pressure. You may need to change some of your habits. Lifestyle changes may include:  Following the DASH diet. This diet is high in fruits, vegetables, and whole grains. It is low in salt, red meat, and added sugars.  Keep your sodium intake below 2,300 mg per day.  Getting at least 30-45 minutes of aerobic exercise at least 4 times per week.  Losing weight if necessary.  Not smoking.  Limiting alcoholic beverages.  Learning ways to reduce stress. Your health care provider may prescribe medicine if lifestyle changes are not enough to get your blood pressure under control, and if one of the following is true:  You are 18-59 years of age and your systolic blood pressure is above 140.  You are 60 years of age or older, and your systolic blood pressure is above 150.  Your diastolic blood pressure is above 90.  You have diabetes, and your systolic blood pressure is over 140 or your diastolic blood pressure is over 90.  You have kidney disease and your blood pressure is above 140/90.  You have heart disease and your blood pressure is above 140/90. Your personal target blood pressure may vary depending on your medical conditions, your age, and other factors. HOME CARE INSTRUCTIONS    Have your blood pressure rechecked as directed by your health care provider.   Take medicines only as directed by your health care provider. Follow the directions carefully. Blood pressure medicines must be taken as prescribed. The medicine does not work as well when you skip doses. Skipping doses also puts you at risk for  problems.  Do not smoke.   Monitor your blood pressure at home as directed by your health care provider. SEEK MEDICAL CARE IF:   You think you are having a reaction to medicines taken.  You have recurrent headaches or feel dizzy.  You have swelling in your ankles.  You have trouble with your vision. SEEK IMMEDIATE MEDICAL CARE IF:  You develop a severe headache or confusion.  You have unusual weakness, numbness, or feel faint.  You have severe chest or abdominal pain.  You vomit repeatedly.  You have trouble breathing. MAKE SURE YOU:   Understand these instructions.  Will watch your condition.  Will get help right away if you are not doing well or get worse.   This information is not intended to replace advice given to you by your health care provider. Make sure you discuss any questions you have with your health care provider.   Document Released: 05/25/2005 Document Revised: 10/09/2014 Document Reviewed: 03/17/2013 Elsevier Interactive Patient Education 2016 [employer]  

## 2015-08-15 NOTE — Assessment & Plan Note (Signed)
Stable con't meds 

## 2015-08-15 NOTE — Progress Notes (Signed)
Patient ID: Katlyne Nishida, female    DOB: 16-Nov-1945  Age: 70 y.o. MRN: [id number]    Subjective:  Subjective HPI Daionna Crossland presents for f/u htn, hyperlipidemia  Review of Systems  Constitutional: Negative for diaphoresis, appetite change, fatigue and unexpected weight change.  Eyes: Negative for pain, redness and visual disturbance.  Respiratory: Negative for cough, chest tightness, shortness of breath and wheezing.   Cardiovascular: Negative for chest pain, palpitations and leg swelling.  Endocrine: Negative for cold intolerance, heat intolerance, polydipsia, polyphagia and polyuria.  Genitourinary: Negative for dysuria, frequency and difficulty urinating.  Neurological: Negative for dizziness, light-headedness, numbness and headaches.    History Past Medical History  Diagnosis Date  . Menopausal syndrome   . Depression   . Hypertension     She has past surgical history that includes Inguinal hernia repair (2000) and Tubal ligation (1970).   Her family history includes Asthma in her son; Breast cancer in her maternal grandmother; Cirrhosis in her brother; Pancreatic cancer in her mother.She reports that she has never smoked. She has never used smokeless tobacco. She reports that she does not drink alcohol or use illicit drugs.  Current Outpatient Prescriptions on File Prior to Visit  Medication Sig Dispense Refill  . albuterol (PROVENTIL HFA;VENTOLIN HFA) 108 (90 Base) MCG/ACT inhaler Inhale 2 puffs into the lungs every 6 (six) hours as needed for wheezing or shortness of breath. 1 Inhaler 2  . beclomethasone (QVAR) 40 MCG/ACT inhaler Inhale 2 puffs into the lungs 2 (two) times daily. 1 Inhaler 2  . buPROPion (WELLBUTRIN XL) 150 MG 24 hr tablet TAKE THREE TABLETS BY MOUTH ONCE DAILY 270 tablet 0  . Cholecalciferol (VITAMIN D) 2000 UNITS CAPS Take by mouth.    . fluticasone (FLONASE) 50 MCG/ACT nasal spray Place 2 sprays into both nostrils daily. 16 g 2  . metoprolol (LOPRESSOR)  100 MG tablet TAKE ONE TABLET BY MOUTH TWICE DAILY 180 tablet 0  . sertraline (ZOLOFT) 100 MG tablet TAKE ONE TABLET BY MOUTH ONCE DAILY 90 tablet 0  . simvastatin (ZOCOR) 40 MG tablet Take 1 tablet (40 mg total) by mouth at bedtime. 30 tablet 2  . spironolactone (ALDACTONE) 50 MG tablet TAKE ONE TABLET BY MOUTH ONCE DAILY 90 tablet 0  . traMADol (ULTRAM) 50 MG tablet TAKE ONE TABLET BY MOUTH EVERY 6 HOURS AS NEEDED FOR PAIN 60 tablet 0  . triamcinolone cream (KENALOG) 0.5 % Apply 1 application topically 3 (three) times daily. 30 g 1   No current facility-administered medications on file prior to visit.     Objective:  Objective Physical Exam  Constitutional: She is oriented to person, place, and time. She appears well-developed and well-nourished.  HENT:  Head: Normocephalic and atraumatic.  Eyes: Conjunctivae and EOM are normal.  Neck: Normal range of motion. Neck supple. No JVD present. Carotid bruit is not present. No thyromegaly present.  Cardiovascular: Normal rate, regular rhythm and normal heart sounds.   No murmur heard. Pulmonary/Chest: Effort normal and breath sounds normal. No respiratory distress. She has no wheezes. She has no rales. She exhibits no tenderness.  Musculoskeletal: She exhibits no edema.  Neurological: She is alert and oriented to person, place, and time.  Psychiatric: She has a normal mood and affect.   BP 143/82 mmHg  Pulse 67  Temp(Src) 98.1 F (36.7 C) (Oral)  Ht '5\' 7"'  (1.702 m)  Wt 256 lb 12.8 oz (116.484 kg)  BMI 40.21 kg/m2  SpO2 100% Wt Readings from Last 3  Encounters:  08/15/15 256 lb 12.8 oz (116.484 kg)  07/22/15 257 lb (116.574 kg)  11/15/14 261 lb 3.2 oz (118.48 kg)     Lab Results  Component Value Date   WBC 8.8 11/15/2014   HGB 13.3 11/15/2014   HCT 41.7 11/15/2014   PLT 267.0 11/15/2014   GLUCOSE 76 08/15/2015   CHOL 210* 08/15/2015   TRIG 100.0 08/15/2015   HDL 43.30 08/15/2015   LDLDIRECT 148.0 10/14/2010   LDLCALC 147*  08/15/2015   ALT 15 08/15/2015   AST 15 08/15/2015   NA 142 08/15/2015   K 4.3 08/15/2015   CL 105 08/15/2015   CREATININE 0.88 08/15/2015   BUN 12 08/15/2015   CO2 31 08/15/2015   TSH 1.31 11/09/2008   HGBA1C 6.0 07/12/2014   MICROALBUR 0.8 11/15/2014    Dg Chest 2 View  08/30/2014  CLINICAL DATA:  Cough congestion and upper respiratory symptoms for 1 week; nonsmoker EXAM: CHEST  2 VIEW COMPARISON:  PA and lateral chest x-ray of February 07, 2009 FINDINGS: The lungs are well-expanded. There is no focal infiltrate. There is no pleural effusion. The heart and pulmonary vascularity are normal. The mediastinum is normal in width. There is calcification of the anterior longitudinal ligament of the thoracic spine. IMPRESSION: There is no pneumonia nor other active cardiopulmonary disease. Electronically Signed   By: David  Martinique   On: 08/30/2014 12:28     Assessment & Plan:  Plan I have discontinued Ms. Kustra's losartan, azithromycin, and benzonatate. I am also having her start on losartan. Additionally, I am having her maintain her Vitamin D, triamcinolone cream, simvastatin, traMADol, spironolactone, fluticasone, beclomethasone, albuterol, sertraline, buPROPion, metoprolol, zoster vaccine live (PF), and Tdap.  Meds ordered this encounter  Medications  . zoster vaccine live, PF, (ZOSTAVAX) 27035 UNT/0.65ML injection    Sig: Inject 19,400 Units into the skin once.    Dispense:  1 each    Refill:  0  . Tdap (BOOSTRIX) 5-2.5-18.5 LF-MCG/0.5 injection    Sig: Inject 0.5 mLs into the muscle once.    Dispense:  0.5 mL    Refill:  0  . losartan (COZAAR) 50 MG tablet    Sig: Take 1 tablet (50 mg total) by mouth daily.    Dispense:  90 tablet    Refill:  3    Problem List Items Addressed This Visit    Essential hypertension   Relevant Medications   losartan (COZAAR) 50 MG tablet   Other Relevant Orders   Lipid panel (Completed)   POCT urinalysis dipstick (Completed)   Comp Met (CMET)  (Completed)   Hyperlipidemia   Relevant Medications   losartan (COZAAR) 50 MG tablet   Other Relevant Orders   Lipid panel (Completed)   POCT urinalysis dipstick (Completed)   Comp Met (CMET) (Completed)    Other Visit Diagnoses    Need for shingles vaccine    -  Primary    Relevant Medications    zoster vaccine live, PF, (ZOSTAVAX) 00938 UNT/0.65ML injection    Need for prophylactic vaccination with combined diphtheria-tetanus-pertussis (DTP) vaccine        Relevant Medications    Tdap (BOOSTRIX) 5-2.5-18.5 LF-MCG/0.5 injection    Preventative health care        Relevant Orders    Ambulatory referral to Gastroenterology    Need for hepatitis C screening test        Relevant Orders    Hepatitis C antibody       Follow-up: Return  in about 3 weeks (around 09/05/2015) for hypertension.  Garnet Koyanagi, DO

## 2015-08-15 NOTE — Progress Notes (Signed)
Pre visit review using our clinic review tool, if applicable. No additional management support is needed unless otherwise documented below in the visit note. 

## 2015-08-15 NOTE — Assessment & Plan Note (Signed)
Check labs 

## 2015-08-16 LAB — HEPATITIS C ANTIBODY: HCV AB: NEGATIVE

## 2015-08-20 ENCOUNTER — Telehealth: Payer: Self-pay | Admitting: *Deleted

## 2015-08-20 DIAGNOSIS — E785 Hyperlipidemia, unspecified: Secondary | ICD-10-CM

## 2015-08-20 MED ORDER — ATORVASTATIN CALCIUM 20 MG PO TABS: 20.0000 mg | ORAL_TABLET | Freq: Every day | ORAL | Status: DC

## 2015-08-20 NOTE — Telephone Encounter (Signed)
Notified pt and she voices understanding. Lab appt scheduled for 11/20/15 at 8am. Future orders entered. Rx sent.

## 2015-08-20 NOTE — Telephone Encounter (Signed)
-----   Message from Rosalita Chessman, DO sent at 08/17/2015  5:29 PM EST ----- Cholesterol--- LDL goal < 100,  HDL >40,  TG < 150.  Diet and exercise will increase HDL and decrease LDL and TG.  Fish,  Fish Oil, Flaxseed oil will also help increase the HDL and decrease Triglycerides.   Recheck labs in 3 months----  Stop zocor and change to lipitor 20 mg  1 po qhs, 2 refills Recheck 3 months.

## 2015-08-30 ENCOUNTER — Ambulatory Visit
Admission: RE | Admit: 2015-08-30 | Discharge: 2015-08-30 | Disposition: A | Discharge status: Home / Self Care | Payer: Medicare Other | Source: Ambulatory Visit

## 2015-08-30 DIAGNOSIS — Z1231 Encounter for screening mammogram for malignant neoplasm of breast: Secondary | ICD-10-CM

## 2015-10-09 ENCOUNTER — Ambulatory Visit
Admission: RE | Admit: 2015-10-09 | Discharge: 2015-10-09 | Disposition: A | Discharge status: Home / Self Care | Payer: Medicare Other | Source: Ambulatory Visit | Attending: Family Medicine | Admitting: Family Medicine

## 2015-10-09 DIAGNOSIS — E2839 Other primary ovarian failure: Secondary | ICD-10-CM

## 2015-11-20 ENCOUNTER — Other Ambulatory Visit (INDEPENDENT_AMBULATORY_CARE_PROVIDER_SITE_OTHER): Payer: Medicare Other

## 2015-11-20 DIAGNOSIS — E785 Hyperlipidemia, unspecified: Secondary | ICD-10-CM

## 2015-11-20 LAB — LIPID PANEL
CHOLESTEROL: 171 mg/dL (ref 0–200)
HDL: 42.3 mg/dL (ref >39.00)
LDL Cholesterol: 103 mg/dL — ABNORMAL HIGH (ref 0–99)
NonHDL: 128.42
TRIGLYCERIDES: 126 mg/dL (ref 0.0–149.0)
Total CHOL/HDL Ratio: 4
VLDL: 25.2 mg/dL (ref 0.0–40.0)

## 2015-11-27 ENCOUNTER — Other Ambulatory Visit: Payer: Self-pay | Admitting: *Deleted

## 2015-11-27 DIAGNOSIS — I10 Essential (primary) hypertension: Secondary | ICD-10-CM

## 2015-11-27 DIAGNOSIS — E785 Hyperlipidemia, unspecified: Secondary | ICD-10-CM

## 2016-01-01 ENCOUNTER — Other Ambulatory Visit: Payer: Self-pay | Admitting: Family Medicine

## 2016-02-11 ENCOUNTER — Other Ambulatory Visit: Payer: Self-pay | Admitting: Family Medicine

## 2016-02-11 NOTE — Telephone Encounter (Signed)
Please schedule pt for follow up appt for hypertension Thank you----PC

## 2016-02-11 NOTE — Telephone Encounter (Signed)
Called patient and scheduled follow up for September 29

## 2016-03-06 ENCOUNTER — Encounter: Payer: Self-pay | Admitting: Family Medicine

## 2016-03-06 ENCOUNTER — Ambulatory Visit (INDEPENDENT_AMBULATORY_CARE_PROVIDER_SITE_OTHER): Payer: Medicare Other | Admitting: Family Medicine

## 2016-03-06 VITALS — BP 145/76 | HR 68 | Temp 98.3°F | Resp 18 | Ht 67.0 in | Wt 264.4 lb

## 2016-03-06 DIAGNOSIS — I10 Essential (primary) hypertension: Secondary | ICD-10-CM | POA: Diagnosis not present

## 2016-03-06 DIAGNOSIS — R21 Rash and other nonspecific skin eruption: Secondary | ICD-10-CM

## 2016-03-06 DIAGNOSIS — E785 Hyperlipidemia, unspecified: Secondary | ICD-10-CM

## 2016-03-06 LAB — LIPID PANEL
Cholesterol: 178 mg/dL (ref 0–200)
HDL: 39.1 mg/dL (ref >39.00)
LDL Cholesterol: 116 mg/dL — ABNORMAL HIGH (ref 0–99)
NONHDL: 139.15
Total CHOL/HDL Ratio: 5
Triglycerides: 116 mg/dL (ref 0.0–149.0)
VLDL: 23.2 mg/dL (ref 0.0–40.0)

## 2016-03-06 LAB — COMPREHENSIVE METABOLIC PANEL
ALK PHOS: 70 U/L (ref 39–117)
ALT: 10 U/L (ref 0–35)
AST: 12 U/L (ref 0–37)
Albumin: 3.8 g/dL (ref 3.5–5.2)
BUN: 10 mg/dL (ref 6–23)
CHLORIDE: 104 meq/L (ref 96–112)
CO2: 34 meq/L — AB (ref 19–32)
Calcium: 9 mg/dL (ref 8.4–10.5)
Creatinine, Ser: 0.76 mg/dL (ref 0.40–1.20)
GFR: 97.18 mL/min (ref >60.00)
GLUCOSE: 95 mg/dL (ref 70–99)
POTASSIUM: 3.9 meq/L (ref 3.5–5.1)
SODIUM: 141 meq/L (ref 135–145)
TOTAL PROTEIN: 7.4 g/dL (ref 6.0–8.3)
Total Bilirubin: 0.6 mg/dL (ref 0.2–1.2)

## 2016-03-06 MED ORDER — METOPROLOL TARTRATE 100 MG PO TABS: 100.0000 mg | ORAL_TABLET | Freq: Two times a day (BID) | ORAL | 0 refills | Status: DC

## 2016-03-06 MED ORDER — ATORVASTATIN CALCIUM 20 MG PO TABS: 20.0000 mg | ORAL_TABLET | Freq: Every day | ORAL | 2 refills | Status: DC

## 2016-03-06 MED ORDER — LOSARTAN POTASSIUM 50 MG PO TABS: 50.0000 mg | ORAL_TABLET | Freq: Every day | ORAL | 3 refills | Status: DC

## 2016-03-06 MED ORDER — TRIAMCINOLONE ACETONIDE 0.5 % EX CREA: 1.0000 "application " | TOPICAL_CREAM | Freq: Three times a day (TID) | CUTANEOUS | 1 refills | Status: DC

## 2016-03-06 NOTE — Patient Instructions (Signed)
Hypertension Hypertension, commonly called high blood pressure, is when the force of blood pumping through your arteries is too strong. Your arteries are the blood vessels that carry blood from your heart throughout your body. A blood pressure reading consists of a higher number over a lower number, such as 110/72. The higher number (systolic) is the pressure inside your arteries when your heart pumps. The lower number (diastolic) is the pressure inside your arteries when your heart relaxes. Ideally you want your blood pressure below 120/80. Hypertension forces your heart to work harder to pump blood. Your arteries may become narrow or stiff. Having untreated or uncontrolled hypertension can cause heart attack, stroke, kidney disease, and other problems. RISK FACTORS Some risk factors for high blood pressure are controllable. Others are not.  Risk factors you cannot control include:   Race. You may be at higher risk if you are African American.  Age. Risk increases with age.  Gender. Men are at higher risk than women before age 45 years. After age 65, women are at higher risk than men. Risk factors you can control include:  Not getting enough exercise or physical activity.  Being overweight.  Getting too much fat, sugar, calories, or salt in your diet.  Drinking too much alcohol. SIGNS AND SYMPTOMS Hypertension does not usually cause signs or symptoms. Extremely high blood pressure (hypertensive crisis) may cause headache, anxiety, shortness of breath, and nosebleed. DIAGNOSIS To check if you have hypertension, your health care provider will measure your blood pressure while you are seated, with your arm held at the level of your heart. It should be measured at least twice using the same arm. Certain conditions can cause a difference in blood pressure between your right and left arms. A blood pressure reading that is higher than normal on one occasion does not mean that you need treatment. If  it is not clear whether you have high blood pressure, you may be asked to return on a different day to have your blood pressure checked again. Or, you may be asked to monitor your blood pressure at home for 1 or more weeks. TREATMENT Treating high blood pressure includes making lifestyle changes and possibly taking medicine. Living a healthy lifestyle can help lower high blood pressure. You may need to change some of your habits. Lifestyle changes may include:  Following the DASH diet. This diet is high in fruits, vegetables, and whole grains. It is low in salt, red meat, and added sugars.  Keep your sodium intake below 2,300 mg per day.  Getting at least 30-45 minutes of aerobic exercise at least 4 times per week.  Losing weight if necessary.  Not smoking.  Limiting alcoholic beverages.  Learning ways to reduce stress. Your health care provider may prescribe medicine if lifestyle changes are not enough to get your blood pressure under control, and if one of the following is true:  You are 18-59 years of age and your systolic blood pressure is above 140.  You are 60 years of age or older, and your systolic blood pressure is above 150.  Your diastolic blood pressure is above 90.  You have diabetes, and your systolic blood pressure is over 140 or your diastolic blood pressure is over 90.  You have kidney disease and your blood pressure is above 140/90.  You have heart disease and your blood pressure is above 140/90. Your personal target blood pressure may vary depending on your medical conditions, your age, and other factors. HOME CARE INSTRUCTIONS    Have your blood pressure rechecked as directed by your health care provider.   Take medicines only as directed by your health care provider. Follow the directions carefully. Blood pressure medicines must be taken as prescribed. The medicine does not work as well when you skip doses. Skipping doses also puts you at risk for  problems.  Do not smoke.   Monitor your blood pressure at home as directed by your health care provider. SEEK MEDICAL CARE IF:   You think you are having a reaction to medicines taken.  You have recurrent headaches or feel dizzy.  You have swelling in your ankles.  You have trouble with your vision. SEEK IMMEDIATE MEDICAL CARE IF:  You develop a severe headache or confusion.  You have unusual weakness, numbness, or feel faint.  You have severe chest or abdominal pain.  You vomit repeatedly.  You have trouble breathing. MAKE SURE YOU:   Understand these instructions.  Will watch your condition.  Will get help right away if you are not doing well or get worse.   This information is not intended to replace advice given to you by your health care provider. Make sure you discuss any questions you have with your health care provider.   Document Released: 05/25/2005 Document Revised: 10/09/2014 Document Reviewed: 03/17/2013 Elsevier Interactive Patient Education 2016 [employer]  

## 2016-03-06 NOTE — Progress Notes (Signed)
Subjective:    Patient ID: Michaela Martinez, female    DOB: 04-Feb-1946, 70 y.o.   MRN: [id number]  Chief Complaint  Patient presents with  . Hypertension    follow up    HPI Patient is in today for bp f/u.  No complaints.    Past Medical History:  Diagnosis Date  . Depression   . Hypertension   . Menopausal syndrome     Past Surgical History:  Procedure Laterality Date  . INGUINAL HERNIA REPAIR  2000  . TUBAL LIGATION  1970    Family History  Problem Relation Age of Onset  . Diabetes    . Hypertension    . Liver disease    . Pancreatic cancer    . Alcohol abuse    . Depression    . Asthma Son   . Pancreatic cancer Mother   . Breast cancer Maternal Grandmother   . Cirrhosis Brother     Social History   Social History  . Marital status: Married    Spouse name: N/A  . Number of children: N/A  . Years of education: N/A   Occupational History  . Not on file.   Social History Main Topics  . Smoking status: Never Smoker  . Smokeless tobacco: Never Used  . Alcohol use No  . Drug use: No  . Sexual activity: Not on file   Other Topics Concern  . Not on file   Social History Narrative  . No narrative on file    Outpatient Medications Prior to Visit  Medication Sig Dispense Refill  . albuterol (PROVENTIL HFA;VENTOLIN HFA) 108 (90 Base) MCG/ACT inhaler Inhale 2 puffs into the lungs every 6 (six) hours as needed for wheezing or shortness of breath. 1 Inhaler 2  . beclomethasone (QVAR) 40 MCG/ACT inhaler Inhale 2 puffs into the lungs 2 (two) times daily. 1 Inhaler 2  . buPROPion (WELLBUTRIN XL) 150 MG 24 hr tablet TAKE THREE TABLETS BY MOUTH ONCE DAILY 270 tablet 0  . Cholecalciferol (VITAMIN D) 2000 UNITS CAPS Take by mouth.    . fluticasone (FLONASE) 50 MCG/ACT nasal spray Place 2 sprays into both nostrils daily. 16 g 2  . sertraline (ZOLOFT) 100 MG tablet TAKE ONE TABLET BY MOUTH ONCE DAILY 90 tablet 0  . simvastatin (ZOCOR) 40 MG tablet Take 1 tablet (40 mg  total) by mouth at bedtime. 30 tablet 2  . spironolactone (ALDACTONE) 50 MG tablet TAKE ONE TABLET BY MOUTH ONCE DAILY 90 tablet 0  . Tdap (BOOSTRIX) 5-2.5-18.5 LF-MCG/0.5 injection Inject 0.5 mLs into the muscle once. 0.5 mL 0  . traMADol (ULTRAM) 50 MG tablet TAKE ONE TABLET BY MOUTH EVERY 6 HOURS AS NEEDED FOR PAIN 60 tablet 0  . zoster vaccine live, PF, (ZOSTAVAX) 29562 UNT/0.65ML injection Inject 19,400 Units into the skin once. 1 each 0  . atorvastatin (LIPITOR) 20 MG tablet Take 1 tablet (20 mg total) by mouth at bedtime. 30 tablet 2  . losartan (COZAAR) 50 MG tablet Take 1 tablet (50 mg total) by mouth daily. 90 tablet 3  . metoprolol (LOPRESSOR) 100 MG tablet TAKE ONE TABLET BY MOUTH TWICE DAILY 180 tablet 0  . triamcinolone cream (KENALOG) 0.5 % Apply 1 application topically 3 (three) times daily. 30 g 1   No facility-administered medications prior to visit.     Allergies  Allergen Reactions  . Norvasc [Amlodipine Besylate] Other (See Comments)    Makes  Me feel crazy and dizziness  Review of Systems  Constitutional: Negative for chills, fever and malaise/fatigue.  HENT: Negative for congestion and hearing loss.   Eyes: Negative for discharge.  Respiratory: Negative for cough, sputum production and shortness of breath.   Cardiovascular: Negative for chest pain, palpitations and leg swelling.  Gastrointestinal: Negative for abdominal pain, blood in stool, constipation, diarrhea, heartburn, nausea and vomiting.  Genitourinary: Negative for dysuria, frequency, hematuria and urgency.  Musculoskeletal: Negative for back pain, falls and myalgias.  Skin: Negative for rash.  Neurological: Negative for dizziness, sensory change, loss of consciousness, weakness and headaches.  Endo/Heme/Allergies: Negative for environmental allergies. Does not bruise/bleed easily.  Psychiatric/Behavioral: Negative for depression and suicidal ideas. The patient is not nervous/anxious and does not have  insomnia.        Objective:    Physical Exam  Constitutional: She is oriented to person, place, and time. She appears well-developed and well-nourished.  HENT:  Head: Normocephalic and atraumatic.  Eyes: Conjunctivae and EOM are normal.  Neck: Normal range of motion. Neck supple. No JVD present. Carotid bruit is not present. No thyromegaly present.  Cardiovascular: Normal rate, regular rhythm and normal heart sounds.   No murmur heard. Pulmonary/Chest: Effort normal and breath sounds normal. No respiratory distress. She has no wheezes. She has no rales. She exhibits no tenderness.  Musculoskeletal: She exhibits edema.  Neurological: She is alert and oriented to person, place, and time.  Psychiatric: She has a normal mood and affect.  Nursing note and vitals reviewed.   BP (!) 145/76 (BP Location: Left Arm, Patient Position: Sitting, Cuff Size: Large)   Pulse 68   Temp 98.3 F (36.8 C) (Oral)   Resp 18   Ht 5\' 7"  (1.702 m)   Wt 264 lb 6.4 oz (119.9 kg)   SpO2 99%   BMI 41.41 kg/m  Wt Readings from Last 3 Encounters:  03/06/16 264 lb 6.4 oz (119.9 kg)  08/15/15 256 lb 12.8 oz (116.5 kg)  07/22/15 257 lb (116.6 kg)     Lab Results  Component Value Date   WBC 8.8 11/15/2014   HGB 13.3 11/15/2014   HCT 41.7 11/15/2014   PLT 267.0 11/15/2014   GLUCOSE 76 08/15/2015   CHOL 171 11/20/2015   TRIG 126.0 11/20/2015   HDL 42.30 11/20/2015   LDLDIRECT 148.0 10/14/2010   LDLCALC 103 (H) 11/20/2015   ALT 15 08/15/2015   AST 15 08/15/2015   NA 142 08/15/2015   K 4.3 08/15/2015   CL 105 08/15/2015   CREATININE 0.88 08/15/2015   BUN 12 08/15/2015   CO2 31 08/15/2015   TSH 1.31 11/09/2008   HGBA1C 6.0 07/12/2014   MICROALBUR 0.8 11/15/2014    Lab Results  Component Value Date   TSH 1.31 11/09/2008   Lab Results  Component Value Date   WBC 8.8 11/15/2014   HGB 13.3 11/15/2014   HCT 41.7 11/15/2014   MCV 85.5 11/15/2014   PLT 267.0 11/15/2014   Lab Results    Component Value Date   NA 142 08/15/2015   K 4.3 08/15/2015   CO2 31 08/15/2015   GLUCOSE 76 08/15/2015   BUN 12 08/15/2015   CREATININE 0.88 08/15/2015   BILITOT 0.4 08/15/2015   ALKPHOS 62 08/15/2015   AST 15 08/15/2015   ALT 15 08/15/2015   PROT 7.5 08/15/2015   ALBUMIN 4.2 08/15/2015   CALCIUM 9.4 08/15/2015   GFR 82.19 08/15/2015   Lab Results  Component Value Date   CHOL 171 11/20/2015  Lab Results  Component Value Date   HDL 42.30 11/20/2015   Lab Results  Component Value Date   LDLCALC 103 (H) 11/20/2015   Lab Results  Component Value Date   TRIG 126.0 11/20/2015   Lab Results  Component Value Date   CHOLHDL 4 11/20/2015   Lab Results  Component Value Date   HGBA1C 6.0 07/12/2014       Assessment & Plan:   Problem List Items Addressed This Visit      Unprioritized   Essential hypertension    Stable con't meds       Relevant Medications   atorvastatin (LIPITOR) 20 MG tablet   losartan (COZAAR) 50 MG tablet   metoprolol (LOPRESSOR) 100 MG tablet   Other Relevant Orders   Lipid panel   Comprehensive metabolic panel    Other Visit Diagnoses    Hyperlipidemia LDL goal <100    -  Primary   Relevant Medications   atorvastatin (LIPITOR) 20 MG tablet   losartan (COZAAR) 50 MG tablet   metoprolol (LOPRESSOR) 100 MG tablet   Other Relevant Orders   Lipid panel   Comprehensive metabolic panel   Rash and nonspecific skin eruption       Relevant Medications   triamcinolone cream (KENALOG) 0.5 %      I have changed Ms. Baldree's metoprolol. I am also having her maintain her Vitamin D, simvastatin, traMADol, fluticasone, beclomethasone, albuterol, buPROPion, zoster vaccine live (PF), Tdap, sertraline, spironolactone, triamcinolone cream, atorvastatin, and losartan.  Meds ordered this encounter  Medications  . triamcinolone cream (KENALOG) 0.5 %    Sig: Apply 1 application topically 3 (three) times daily.    Dispense:  30 g    Refill:  1  .  atorvastatin (LIPITOR) 20 MG tablet    Sig: Take 1 tablet (20 mg total) by mouth at bedtime.    Dispense:  30 tablet    Refill:  2  . losartan (COZAAR) 50 MG tablet    Sig: Take 1 tablet (50 mg total) by mouth daily.    Dispense:  90 tablet    Refill:  3  . metoprolol (LOPRESSOR) 100 MG tablet    Sig: Take 1 tablet (100 mg total) by mouth 2 (two) times daily.    Dispense:  180 tablet    Refill:  0     Ann Held, DO

## 2016-03-06 NOTE — Assessment & Plan Note (Signed)
Stable con't meds 

## 2016-03-06 NOTE — Progress Notes (Signed)
Pre visit review using our clinic review tool, if applicable. No additional management support is needed unless otherwise documented below in the visit note. 

## 2016-03-12 ENCOUNTER — Other Ambulatory Visit: Payer: Self-pay | Admitting: Family Medicine

## 2016-03-24 ENCOUNTER — Other Ambulatory Visit: Payer: Self-pay

## 2016-03-24 DIAGNOSIS — E785 Hyperlipidemia, unspecified: Secondary | ICD-10-CM

## 2016-03-24 MED ORDER — ATORVASTATIN CALCIUM 40 MG PO TABS: 40.0000 mg | ORAL_TABLET | Freq: Every day | ORAL | 2 refills | Status: DC

## 2016-04-01 ENCOUNTER — Other Ambulatory Visit: Payer: Self-pay | Admitting: Family Medicine

## 2016-04-21 ENCOUNTER — Other Ambulatory Visit: Payer: Self-pay

## 2016-04-21 DIAGNOSIS — I10 Essential (primary) hypertension: Secondary | ICD-10-CM

## 2016-04-21 MED ORDER — LOSARTAN POTASSIUM 50 MG PO TABS: 50.0000 mg | ORAL_TABLET | Freq: Every day | ORAL | 3 refills | Status: DC

## 2016-04-27 ENCOUNTER — Other Ambulatory Visit: Payer: Self-pay | Admitting: Family Medicine

## 2016-04-27 NOTE — Telephone Encounter (Signed)
Medication filled to pharmacy as requested.   

## 2016-06-16 ENCOUNTER — Telehealth: Payer: Self-pay | Admitting: Family Medicine

## 2016-06-16 NOTE — Telephone Encounter (Signed)
Called patient to schedule awv appt. Left msg for patient to call office to schedule appt.

## 2016-06-16 NOTE — Telephone Encounter (Signed)
11/15/14 [facility] PPPS, INITIAL VISIT [[id number]]  Patient scheduled medicare wellness for 08/03/16 at 9:30am with Hoyle Sauer and physical directly after at 10:30am with PCP

## 2016-07-28 ENCOUNTER — Telehealth: Payer: Self-pay | Admitting: *Deleted

## 2016-07-28 NOTE — Telephone Encounter (Signed)
Scheduled AWV 08/03/16 @10 .

## 2016-07-31 NOTE — Progress Notes (Signed)
Pre visit review using our clinic review tool, if applicable. No additional management support is needed unless otherwise documented below in the visit note. 

## 2016-07-31 NOTE — Progress Notes (Signed)
Subjective:   Michaela Martinez is a 71 y.o. female who presents for Medicare Annual (Subsequent) preventive examination.  Review of Systems:  No ROS.  Medicare Wellness Visit. Cardiac Risk Factors include: advanced age (>60men, >60 women);dyslipidemia;hypertension;obesity (BMI >30kg/m2);sedentary lifestyle Sleep patterns: Sleeps 8-10 hrs per night. Feels rested. Home Safety/Smoke Alarms:  Feels safe in home. Smoke alarms in place.  Living environment; residence and Firearm Safety: Lives at home with husband. No guns. Seat Belt Safety/Bike Helmet: Wears seat belt.   Counseling:   Eye Exam- Wears glasses. [facility] annually. Dental- only as needed.  Female:   Pap- No longer getting screened per pt.       Mammo- Last 08/30/15:  BI-RADS CATEGORY 1: Negative.   Dexa scan-  Last 10/09/15:  Normal CCS- Last 01/05/12: Pre-cancerous polyps removed. F/u 1 yr per report.    Objective:     Vitals: BP (!) 160/80 (BP Location: Left Arm, Patient Position: Sitting, Cuff Size: Large)   Pulse (!) 59   Ht 5\' 7"  (1.702 m)   Wt 258 lb 6.4 oz (117.2 kg)   SpO2 98%   BMI 40.47 kg/m   Body mass index is 40.47 kg/m.   Tobacco History  Smoking Status  . Never Smoker  Smokeless Tobacco  . Never Used     Counseling given: No   Past Medical History:  Diagnosis Date  . Depression   . Hypertension   . Menopausal syndrome    Past Surgical History:  Procedure Laterality Date  . INGUINAL HERNIA REPAIR  2000  . TUBAL LIGATION  1970   Family History  Problem Relation Age of Onset  . Pancreatic cancer Mother   . Cirrhosis Brother   . Diabetes    . Hypertension    . Liver disease    . Pancreatic cancer    . Alcohol abuse    . Depression    . Asthma Son   . Breast cancer Maternal Grandmother    History  Sexual Activity  . Sexual activity: No    Outpatient Encounter Prescriptions as of 08/03/2016  Medication Sig  . buPROPion (WELLBUTRIN XL) 150 MG 24 hr tablet TAKE THREE  TABLETS BY MOUTH ONCE DAILY (Patient taking differently: TAKE THREE TABLETS BY MOUTH ONCE every other day)  . Cholecalciferol (VITAMIN D) 2000 UNITS CAPS Take by mouth.  . fluticasone (FLONASE) 50 MCG/ACT nasal spray Place 2 sprays into both nostrils daily.  Marland Kitchen losartan (COZAAR) 50 MG tablet Take 1 tablet (50 mg total) by mouth daily.  . metoprolol (LOPRESSOR) 100 MG tablet Take 1 tablet (100 mg total) by mouth 2 (two) times daily.  . sertraline (ZOLOFT) 100 MG tablet TAKE ONE TABLET BY MOUTH ONCE DAILY  . simvastatin (ZOCOR) 40 MG tablet Take 1 tablet (40 mg total) by mouth at bedtime. (Patient taking differently: Take 20 mg by mouth at bedtime. )  . spironolactone (ALDACTONE) 50 MG tablet TAKE ONE TABLET BY MOUTH ONCE DAILY  . traMADol (ULTRAM) 50 MG tablet TAKE ONE TABLET BY MOUTH EVERY 6 HOURS AS NEEDED FOR PAIN  . triamcinolone cream (KENALOG) 0.5 % Apply 1 application topically 3 (three) times daily.  Marland Kitchen albuterol (PROVENTIL HFA;VENTOLIN HFA) 108 (90 Base) MCG/ACT inhaler Inhale 2 puffs into the lungs every 6 (six) hours as needed for wheezing or shortness of breath. (Patient not taking: Reported on 08/03/2016)  . atorvastatin (LIPITOR) 40 MG tablet Take 1 tablet (40 mg total) by mouth at bedtime. (Patient not taking: Reported  on 08/03/2016)  . beclomethasone (QVAR) 40 MCG/ACT inhaler Inhale 2 puffs into the lungs 2 (two) times daily. (Patient not taking: Reported on 08/03/2016)  . Tdap (BOOSTRIX) 5-2.5-18.5 LF-MCG/0.5 injection Inject 0.5 mLs into the muscle once.  . zoster vaccine live, PF, (ZOSTAVAX) 21308 UNT/0.65ML injection Inject 19,400 Units into the skin once.   No facility-administered encounter medications on file as of 08/03/2016.     Activities of Daily Living In your present state of health, do you have any difficulty performing the following activities: 08/03/2016 03/06/2016  Hearing? N N  Vision? N N  Difficulty concentrating or making decisions? N N  Walking or climbing stairs?  N Y  Dressing or bathing? N N  Doing errands, shopping? N N  Preparing Food and eating ? N -  Using the Toilet? N -  In the past six months, have you accidently leaked urine? N -  Do you have problems with loss of bowel control? N -  Managing your Medications? N -  Managing your Finances? N -  Housekeeping or managing your Housekeeping? N -  Some recent data might be hidden    Patient Care Team: Ann Held, DO as PCP - General    Assessment:    Physical assessment deferred to PCP.  Exercise Activities and Dietary recommendations   Diet (meal preparation, eat out, water intake, caffeinated beverages, dairy products, fruits and vegetables): well balanced, on average, 3 meals per day      Goals    . Ride stationary bike 2x/week      Fall Risk Fall Risk  08/03/2016 03/06/2016 11/15/2014 11/15/2014 07/12/2014  Falls in the past year? No No No No No   Depression Screen PHQ 2/9 Scores 08/03/2016 03/06/2016 11/15/2014 11/15/2014  PHQ - 2 Score 0 0 0 0     Cognitive Function MMSE - Mini Mental State Exam 08/03/2016  Orientation to time 5  Orientation to Place 5  Registration 3  Attention/ Calculation 4  Recall 3  Language- name 2 objects 2  Language- repeat 1  Language- follow 3 step command 3  Language- read & follow direction 1  Write a sentence 1  Copy design 1  Total score 29        Immunization History  Administered Date(s) Administered  . Influenza,inj,Quad PF,36+ Mos 04/12/2013, 07/12/2014  . Pneumococcal Conjugate-13 07/12/2014  . Pneumococcal Polysaccharide-23 10/06/2012  . Td 06/09/2003  . Tdap 10/08/2015  . Zoster 10/08/2015   Screening Tests Health Maintenance  Topic Date Due  . COLONOSCOPY  03/06/2017 (Originally 01/04/2013)  . MAMMOGRAM  08/29/2017  . TETANUS/TDAP  10/07/2025  . INFLUENZA VACCINE  Addressed  . DEXA SCAN  Completed  . Hepatitis C Screening  Completed  . PNA vac Low Risk Adult  Completed      Plan:     Continue to eat heart  healthy diet (full of fruits, vegetables, whole grains, lean protein, water--limit salt, fat, and sugar intake) and increase physical activity as tolerated.  During the course of the visit the patient was educated and counseled about the following appropriate screening and preventive services:   Vaccines to include Pneumoccal, Influenza, Hepatitis B, Td, Zostavax, HCV  Cardiovascular Disease  Colorectal cancer screening  Bone density screening  Diabetes screening  Glaucoma screening  Mammography/PAP  Nutrition counseling   Patient Instructions (the written plan) was given to the patient.   Naaman Plummer [place], [place]  08/03/2016

## 2016-08-03 ENCOUNTER — Ambulatory Visit: Payer: Medicare Other | Admitting: *Deleted

## 2016-08-03 ENCOUNTER — Encounter: Payer: Self-pay | Admitting: Family Medicine

## 2016-08-03 ENCOUNTER — Ambulatory Visit (INDEPENDENT_AMBULATORY_CARE_PROVIDER_SITE_OTHER): Payer: Medicare Other | Admitting: Family Medicine

## 2016-08-03 VITALS — BP 178/80 | HR 59 | Temp 98.2°F | Resp 17 | Ht 67.0 in | Wt 258.4 lb

## 2016-08-03 DIAGNOSIS — I10 Essential (primary) hypertension: Secondary | ICD-10-CM | POA: Diagnosis not present

## 2016-08-03 DIAGNOSIS — E785 Hyperlipidemia, unspecified: Secondary | ICD-10-CM

## 2016-08-03 DIAGNOSIS — Z0000 Encounter for general adult medical examination without abnormal findings: Secondary | ICD-10-CM

## 2016-08-03 DIAGNOSIS — Z23 Encounter for immunization: Secondary | ICD-10-CM | POA: Diagnosis not present

## 2016-08-03 DIAGNOSIS — Z86010 Personal history of colonic polyps: Secondary | ICD-10-CM | POA: Diagnosis not present

## 2016-08-03 DIAGNOSIS — F320 Major depressive disorder, single episode, mild: Secondary | ICD-10-CM

## 2016-08-03 DIAGNOSIS — R21 Rash and other nonspecific skin eruption: Secondary | ICD-10-CM

## 2016-08-03 LAB — LIPID PANEL
Cholesterol: 142 mg/dL (ref 0–200)
HDL: 39.9 mg/dL (ref >39.00)
LDL Cholesterol: 84 mg/dL (ref 0–99)
NONHDL: 102.31
Total CHOL/HDL Ratio: 4
Triglycerides: 91 mg/dL (ref 0.0–149.0)
VLDL: 18.2 mg/dL (ref 0.0–40.0)

## 2016-08-03 LAB — COMPREHENSIVE METABOLIC PANEL
ALT: 8 U/L (ref 0–35)
AST: 12 U/L (ref 0–37)
Albumin: 4.1 g/dL (ref 3.5–5.2)
Alkaline Phosphatase: 89 U/L (ref 39–117)
BUN: 10 mg/dL (ref 6–23)
CO2: 32 meq/L (ref 19–32)
CREATININE: 0.78 mg/dL (ref 0.40–1.20)
Calcium: 9.4 mg/dL (ref 8.4–10.5)
Chloride: 102 mEq/L (ref 96–112)
GFR: 94.19 mL/min (ref >60.00)
GLUCOSE: 86 mg/dL (ref 70–99)
Potassium: 4.1 mEq/L (ref 3.5–5.1)
Sodium: 139 mEq/L (ref 135–145)
Total Bilirubin: 0.6 mg/dL (ref 0.2–1.2)
Total Protein: 7.6 g/dL (ref 6.0–8.3)

## 2016-08-03 LAB — CBC
HCT: 39.4 % (ref 36.0–46.0)
Hemoglobin: 12.6 g/dL (ref 12.0–15.0)
MCHC: 32 g/dL (ref 30.0–36.0)
MCV: 84.1 fl (ref 78.0–100.0)
PLATELETS: 242 10*3/uL (ref 150.0–400.0)
RBC: 4.69 Mil/uL (ref 3.87–5.11)
RDW: 14.7 % (ref 11.5–15.5)
WBC: 9.2 10*3/uL (ref 4.0–10.5)

## 2016-08-03 MED ORDER — TRIAMCINOLONE ACETONIDE 0.5 % EX CREA: 1.0000 "application " | TOPICAL_CREAM | Freq: Three times a day (TID) | CUTANEOUS | 1 refills | Status: AC

## 2016-08-03 MED ORDER — METOPROLOL TARTRATE 100 MG PO TABS: 100.0000 mg | ORAL_TABLET | Freq: Two times a day (BID) | ORAL | 0 refills | Status: DC

## 2016-08-03 MED ORDER — LOSARTAN POTASSIUM 100 MG PO TABS: 100.0000 mg | ORAL_TABLET | Freq: Every day | ORAL | 3 refills | Status: DC

## 2016-08-03 MED ORDER — BUPROPION HCL ER (XL) 300 MG PO TB24: 300.0000 mg | ORAL_TABLET | Freq: Every day | ORAL | 3 refills | Status: DC

## 2016-08-03 MED ORDER — ZOSTER VAC RECOMB ADJUVANTED 50 MCG/0.5ML IM SUSR: 50.0000 ug | Freq: Every day | INTRAMUSCULAR | 1 refills | Status: DC

## 2016-08-03 MED ORDER — ATORVASTATIN CALCIUM 20 MG PO TABS: 20.0000 mg | ORAL_TABLET | Freq: Every day | ORAL | 1 refills | Status: DC

## 2016-08-03 NOTE — Progress Notes (Signed)
Subjective:     Michaela Martinez is a 71 y.o. female and is here for a comprehensive physical exam. The patient reports no problems.  Social History   Social History  . Marital status: Married    Spouse name: N/A  . Number of children: N/A  . Years of education: N/A   Occupational History  . Not on file.   Social History Main Topics  . Smoking status: Never Smoker  . Smokeless tobacco: Never Used  . Alcohol use No  . Drug use: No  . Sexual activity: No   Other Topics Concern  . Not on file   Social History Narrative  . No narrative on file   Health Maintenance  Topic Date Due  . COLONOSCOPY  03/06/2017 (Originally 01/04/2013)  . MAMMOGRAM  08/29/2017  . TETANUS/TDAP  10/07/2025  . INFLUENZA VACCINE  Addressed  . DEXA SCAN  Completed  . Hepatitis C Screening  Completed  . PNA vac Low Risk Adult  Completed    The following portions of the patient's history were reviewed and updated as appropriate:  She  has a past medical history of Depression; Hypertension; and Menopausal syndrome. She  does not have any pertinent problems on file. She  has a past surgical history that includes Inguinal hernia repair (2000) and Tubal ligation (1970). Her family history includes Asthma in her son; Breast cancer in her maternal grandmother; Cirrhosis in her brother; Pancreatic cancer in her mother. She  reports that she has never smoked. She has never used smokeless tobacco. She reports that she does not drink alcohol or use drugs. She has a current medication list which includes the following prescription(s): bupropion, vitamin d, fluticasone, losartan, metoprolol, sertraline, simvastatin, spironolactone, tdap, tramadol, triamcinolone cream, zoster vaccine live (pf), albuterol, atorvastatin, and beclomethasone. Current Outpatient Prescriptions on File Prior to Visit  Medication Sig Dispense Refill  . buPROPion (WELLBUTRIN XL) 150 MG 24 hr tablet TAKE THREE TABLETS BY MOUTH ONCE DAILY (Patient  taking differently: TAKE THREE TABLETS BY MOUTH ONCE every other day) 270 tablet 1  . Cholecalciferol (VITAMIN D) 2000 UNITS CAPS Take by mouth.    . fluticasone (FLONASE) 50 MCG/ACT nasal spray Place 2 sprays into both nostrils daily. 16 g 2  . losartan (COZAAR) 50 MG tablet Take 1 tablet (50 mg total) by mouth daily. 90 tablet 3  . metoprolol (LOPRESSOR) 100 MG tablet Take 1 tablet (100 mg total) by mouth 2 (two) times daily. 180 tablet 0  . sertraline (ZOLOFT) 100 MG tablet TAKE ONE TABLET BY MOUTH ONCE DAILY 90 tablet 0  . simvastatin (ZOCOR) 40 MG tablet Take 1 tablet (40 mg total) by mouth at bedtime. (Patient taking differently: Take 20 mg by mouth at bedtime. ) 30 tablet 2  . spironolactone (ALDACTONE) 50 MG tablet TAKE ONE TABLET BY MOUTH ONCE DAILY 90 tablet 0  . Tdap (BOOSTRIX) 5-2.5-18.5 LF-MCG/0.5 injection Inject 0.5 mLs into the muscle once. 0.5 mL 0  . traMADol (ULTRAM) 50 MG tablet TAKE ONE TABLET BY MOUTH EVERY 6 HOURS AS NEEDED FOR PAIN 60 tablet 0  . triamcinolone cream (KENALOG) 0.5 % Apply 1 application topically 3 (three) times daily. 30 g 1  . zoster vaccine live, PF, (ZOSTAVAX) 91478 UNT/0.65ML injection Inject 19,400 Units into the skin once. 1 each 0  . albuterol (PROVENTIL HFA;VENTOLIN HFA) 108 (90 Base) MCG/ACT inhaler Inhale 2 puffs into the lungs every 6 (six) hours as needed for wheezing or shortness of breath. (Patient not taking:  Reported on 08/03/2016) 1 Inhaler 2  . atorvastatin (LIPITOR) 40 MG tablet Take 1 tablet (40 mg total) by mouth at bedtime. (Patient not taking: Reported on 08/03/2016) 30 tablet 2  . beclomethasone (QVAR) 40 MCG/ACT inhaler Inhale 2 puffs into the lungs 2 (two) times daily. (Patient not taking: Reported on 08/03/2016) 1 Inhaler 2   No current facility-administered medications on file prior to visit.    She is allergic to norvasc [amlodipine besylate]..  Review of Systems  Review of Systems  Constitutional: Negative for activity change,  appetite change and fatigue.  HENT: Negative for hearing loss, congestion, tinnitus and ear discharge.   Eyes: Negative for visual disturbance (see optho q1y -- vision corrected to 20/20 with glasses).  Respiratory: Negative for cough, chest tightness and shortness of breath.   Cardiovascular: Negative for chest pain, palpitations and leg swelling.  Gastrointestinal: Negative for abdominal pain, diarrhea, constipation and abdominal distention.  Genitourinary: Negative for urgency, frequency, decreased urine volume and difficulty urinating.  Musculoskeletal: Negative for back pain, arthralgias and gait problem.  Skin: Negative for color change, pallor and rash.  Neurological: Negative for dizziness, light-headedness, numbness and headaches.  Hematological: Negative for adenopathy. Does not bruise/bleed easily.  Psychiatric/Behavioral: Negative for suicidal ideas, confusion, sleep disturbance, self-injury, dysphoric mood, decreased concentration and agitation.  Pt is able to read and write and can do all ADLs No risk for falling No abuse/ violence in home    Objective:    BP (!) 178/80 (BP Location: Right Arm, Patient Position: Sitting, Cuff Size: Large)   Pulse (!) 59   Temp 98.2 F (36.8 C) (Oral)   Resp 17   Ht 5\' 7"  (1.702 m)   Wt 258 lb 6.4 oz (117.2 kg)   SpO2 98%   BMI 40.47 kg/m  General appearance: alert, cooperative, appears stated age and no distress Head: Normocephalic, without obvious abnormality, atraumatic Eyes: conjunctivae/corneas clear. PERRL, EOM's intact. Fundi benign. Ears: normal TM's and external ear canals both ears Nose: Nares normal. Septum midline. Mucosa normal. No drainage or sinus tenderness. Throat: lips, mucosa, and tongue normal; teeth and gums normal Neck: no adenopathy, no carotid bruit, no JVD, supple, symmetrical, trachea midline and thyroid not enlarged, symmetric, no tenderness/mass/nodules Back: symmetric, no curvature. ROM normal. No CVA  tenderness. Lungs: clear to auscultation bilaterally Breasts: normal appearance, no masses or tenderness Heart: regular rate and rhythm, S1, S2 normal, no murmur, click, rub or gallop Abdomen: soft, non-tender; bowel sounds normal; no masses,  no organomegaly Pelvic: not indicated; post-menopausal, no abnormal Pap smears in past Extremities: extremities normal, atraumatic, no cyanosis or edema Pulses: 2+ and symmetric Skin: Skin color, texture, turgor normal. No rashes or lesions Lymph nodes: Cervical, supraclavicular, and axillary nodes normal. Neurologic: Alert and oriented X 3, normal strength and tone. Normal symmetric reflexes. Normal coordination and gait    Assessment:    Healthy female exam.      Plan:    ghm utd Check labs See After Visit Summary for Counseling Recommendations    1. Encounter for Medicare annual wellness exam Per RN  2. Essential hypertension High today-- pt was not taking losartan  - CBC - Comprehensive metabolic panel - losartan (COZAAR) 100 MG tablet; Take 1 tablet (100 mg total) by mouth daily.  Dispense: 90 tablet; Refill: 3 - metoprolol (LOPRESSOR) 100 MG tablet; Take 1 tablet (100 mg total) by mouth 2 (two) times daily.  Dispense: 180 tablet; Refill: 0  3. Hyperlipidemia, unspecified hyperlipidemia type Check labs -  Lipid panel - atorvastatin (LIPITOR) 20 MG tablet; Take 1 tablet (20 mg total) by mouth daily.  Dispense: 90 tablet; Refill: 1  4. History of colon polyps   - Ambulatory referral to Gastroenterology  5. Rash and nonspecific skin eruption  - triamcinolone cream (KENALOG) 0.5 %; Apply 1 application topically 3 (three) times daily.  Dispense: 30 g; Refill: 1  6. Depression, major, single episode, mild (HCC)  - buPROPion (WELLBUTRIN XL) 300 MG 24 hr tablet; Take 1 tablet (300 mg total) by mouth daily.  Dispense: 90 tablet; Refill: 3  7. Preventative health care See above  8. Need for shingles vaccine   - Zoster Vac  Recomb Adjuvanted (Poteet) 50 MCG SUSR; Inject 50 mcg into the muscle daily. Repeat in 2-6 months  Dispense: 1 each; Refill: 1

## 2016-08-03 NOTE — Addendum Note (Signed)
Addended by: Marjory Lies on: 08/03/2016 02:40 PM   Modules accepted: Orders

## 2016-08-03 NOTE — Progress Notes (Signed)
reviewed

## 2016-08-03 NOTE — Patient Instructions (Addendum)
Continue to eat heart healthy diet (full of fruits, vegetables, whole grains, lean protein, water--limit salt, fat, and sugar intake) and increase physical activity as tolerated.  Preventive Care 65 Years and Older, Female Preventive care refers to lifestyle choices and visits with your health care provider that can promote health and wellness. What does preventive care include?  A yearly physical exam. This is also called an annual well check.  Dental exams once or twice a year.  Routine eye exams. Ask your health care provider how often you should have your eyes checked.  Personal lifestyle choices, including:  Daily care of your teeth and gums.  Regular physical activity.  Eating a healthy diet.  Avoiding tobacco and drug use.  Limiting alcohol use.  Practicing safe sex.  Taking low-dose aspirin every day.  Taking vitamin and mineral supplements as recommended by your health care provider. What happens during an annual well check? The services and screenings done by your health care provider during your annual well check will depend on your age, overall health, lifestyle risk factors, and family history of disease. Counseling  Your health care provider may ask you questions about your:  Alcohol use.  Tobacco use.  Drug use.  Emotional well-being.  Home and relationship well-being.  Sexual activity.  Eating habits.  History of falls.  Memory and ability to understand (cognition).  Work and work environment.  Reproductive health. Screening  You may have the following tests or measurements:  Height, weight, and BMI.  Blood pressure.  Lipid and cholesterol levels. These may be checked every 5 years, or more frequently if you are over 50 years old.  Skin check.  Lung cancer screening. You may have this screening every year starting at age 55 if you have a 30-pack-year history of smoking and currently smoke or have quit within the past 15 years.  Fecal  occult blood test (FOBT) of the stool. You may have this test every year starting at age 50.  Flexible sigmoidoscopy or colonoscopy. You may have a sigmoidoscopy every 5 years or a colonoscopy every 10 years starting at age 50.  Hepatitis C blood test.  Hepatitis B blood test.  Sexually transmitted disease (STD) testing.  Diabetes screening. This is done by checking your blood sugar (glucose) after you have not eaten for a while (fasting). You may have this done every 1-3 years.  Bone density scan. This is done to screen for osteoporosis. You may have this done starting at age 65.  Mammogram. This may be done every 1-2 years. Talk to your health care provider about how often you should have regular mammograms. Talk with your health care provider about your test results, treatment options, and if necessary, the need for more tests. Vaccines  Your health care provider may recommend certain vaccines, such as:  Influenza vaccine. This is recommended every year.  Tetanus, diphtheria, and acellular pertussis (Tdap, Td) vaccine. You may need a Td booster every 10 years.  Varicella vaccine. You may need this if you have not been vaccinated.  Zoster vaccine. You may need this after age 60.  Measles, mumps, and rubella (MMR) vaccine. You may need at least one dose of MMR if you were born in 1957 or later. You may also need a second dose.  Pneumococcal 13-valent conjugate (PCV13) vaccine. One dose is recommended after age 65.  Pneumococcal polysaccharide (PPSV23) vaccine. One dose is recommended after age 65.  Meningococcal vaccine. You may need this if you have certain conditions.    Hepatitis A vaccine. You may need this if you have certain conditions or if you travel or work in places where you may be exposed to hepatitis A.  Hepatitis B vaccine. You may need this if you have certain conditions or if you travel or work in places where you may be exposed to hepatitis B.  Haemophilus  influenzae type b (Hib) vaccine. You may need this if you have certain conditions. Talk to your health care provider about which screenings and vaccines you need and how often you need them. This information is not intended to replace advice given to you by your health care provider. Make sure you discuss any questions you have with your health care provider. Document Released: 06/21/2015 Document Revised: 02/12/2016 Document Reviewed: 03/26/2015 Elsevier Interactive Patient Education  2017 [employer]vier Inc[employer]  

## 2016-08-06 ENCOUNTER — Other Ambulatory Visit: Payer: Self-pay | Admitting: Family Medicine

## 2016-08-31 ENCOUNTER — Encounter: Payer: Self-pay | Admitting: Family Medicine

## 2016-10-07 ENCOUNTER — Other Ambulatory Visit: Payer: Self-pay | Admitting: Family Medicine

## 2016-10-31 ENCOUNTER — Other Ambulatory Visit: Payer: Self-pay | Admitting: Family Medicine

## 2016-11-24 ENCOUNTER — Telehealth: Payer: Self-pay | Admitting: Family Medicine

## 2016-11-24 MED ORDER — SPIRONOLACTONE 25 MG PO TABS: 25.0000 mg | ORAL_TABLET | Freq: Two times a day (BID) | ORAL | 1 refills | Status: DC

## 2016-11-24 NOTE — Telephone Encounter (Signed)
Called the pharmacy and they do have the 25's. PCP did verbally give her ok Gave the pharmacy a verbal refill for spironolactone 25 mg bid #60 with 1 refill Patient notified.

## 2016-11-24 NOTE — Telephone Encounter (Signed)
Do they have 25 mg -- we can change to 2 a day

## 2016-11-24 NOTE — Telephone Encounter (Signed)
Patient called stating she needs a refill on Spironolactone 50 MG TAKE ONE TABLET BY MOUTH ONCE DAILY      Per patient [facility] [address] did not have medication and only gave her 7 pills last month, they are asking for alternate medication. Patient has been out since 5/30 when she only got 7 pills. Patient would like meds sent to [facility] on [address]   [phone] call back number

## 2017-02-08 ENCOUNTER — Other Ambulatory Visit: Payer: Self-pay | Admitting: Family Medicine

## 2017-02-09 NOTE — Telephone Encounter (Signed)
Patient will be seen on 9.6.18 and refills will be addressed then/last filled Metoprolol on 3.13.18 & Spironolactone on 7.29.18/thx dmf

## 2017-02-11 ENCOUNTER — Encounter: Payer: Self-pay | Admitting: Family Medicine

## 2017-02-11 ENCOUNTER — Ambulatory Visit (INDEPENDENT_AMBULATORY_CARE_PROVIDER_SITE_OTHER): Payer: Medicare Other | Admitting: Family Medicine

## 2017-02-11 VITALS — BP 130/74 | HR 67 | Temp 98.2°F | Ht 67.0 in | Wt 261.4 lb

## 2017-02-11 DIAGNOSIS — Z23 Encounter for immunization: Secondary | ICD-10-CM

## 2017-02-11 DIAGNOSIS — J301 Allergic rhinitis due to pollen: Secondary | ICD-10-CM

## 2017-02-11 DIAGNOSIS — E2839 Other primary ovarian failure: Secondary | ICD-10-CM | POA: Diagnosis not present

## 2017-02-11 DIAGNOSIS — R600 Localized edema: Secondary | ICD-10-CM | POA: Insufficient documentation

## 2017-02-11 DIAGNOSIS — F320 Major depressive disorder, single episode, mild: Secondary | ICD-10-CM

## 2017-02-11 DIAGNOSIS — E785 Hyperlipidemia, unspecified: Secondary | ICD-10-CM | POA: Diagnosis not present

## 2017-02-11 DIAGNOSIS — I10 Essential (primary) hypertension: Secondary | ICD-10-CM | POA: Diagnosis not present

## 2017-02-11 LAB — LIPID PANEL
CHOL/HDL RATIO: 5
Cholesterol: 179 mg/dL (ref 0–200)
HDL: 39.2 mg/dL (ref >39.00)
LDL CALC: 122 mg/dL — AB (ref 0–99)
NonHDL: 139.3
Triglycerides: 87 mg/dL (ref 0.0–149.0)
VLDL: 17.4 mg/dL (ref 0.0–40.0)

## 2017-02-11 LAB — COMPREHENSIVE METABOLIC PANEL
ALT: 11 U/L (ref 0–35)
AST: 14 U/L (ref 0–37)
Albumin: 3.9 g/dL (ref 3.5–5.2)
Alkaline Phosphatase: 72 U/L (ref 39–117)
BUN: 13 mg/dL (ref 6–23)
CHLORIDE: 102 meq/L (ref 96–112)
CO2: 33 meq/L — AB (ref 19–32)
CREATININE: 0.88 mg/dL (ref 0.40–1.20)
Calcium: 9.5 mg/dL (ref 8.4–10.5)
GFR: 81.83 mL/min (ref >60.00)
Glucose, Bld: 95 mg/dL (ref 70–99)
Potassium: 4.3 mEq/L (ref 3.5–5.1)
SODIUM: 141 meq/L (ref 135–145)
Total Bilirubin: 0.5 mg/dL (ref 0.2–1.2)
Total Protein: 7.2 g/dL (ref 6.0–8.3)

## 2017-02-11 MED ORDER — SPIRONOLACTONE 50 MG PO TABS: 50.0000 mg | ORAL_TABLET | Freq: Every day | ORAL | 1 refills | Status: DC

## 2017-02-11 MED ORDER — SPIRONOLACTONE 50 MG PO TABS: 50.0000 mg | ORAL_TABLET | Freq: Every day | ORAL | 0 refills | Status: DC

## 2017-02-11 MED ORDER — LOSARTAN POTASSIUM 100 MG PO TABS: 100.0000 mg | ORAL_TABLET | Freq: Every day | ORAL | 3 refills | Status: DC

## 2017-02-11 MED ORDER — METOPROLOL TARTRATE 100 MG PO TABS: 100.0000 mg | ORAL_TABLET | Freq: Two times a day (BID) | ORAL | 1 refills | Status: DC

## 2017-02-11 MED ORDER — SERTRALINE HCL 100 MG PO TABS: 100.0000 mg | ORAL_TABLET | Freq: Every day | ORAL | 1 refills | Status: DC

## 2017-02-11 MED ORDER — VITAMIN D 50 MCG (2000 UT) PO CAPS: ORAL_CAPSULE | ORAL | Status: AC

## 2017-02-11 MED ORDER — BUPROPION HCL ER (XL) 300 MG PO TB24: 300.0000 mg | ORAL_TABLET | Freq: Every day | ORAL | 3 refills | Status: DC

## 2017-02-11 MED ORDER — FLUTICASONE PROPIONATE 50 MCG/ACT NA SUSP: 2.0000 | Freq: Every day | NASAL | 2 refills | Status: DC

## 2017-02-11 NOTE — Assessment & Plan Note (Signed)
Well controlled, no changes to meds. Encouraged heart healthy diet such as the DASH diet and exercise as tolerated.  °

## 2017-02-11 NOTE — Progress Notes (Signed)
Patient ID: Michaela Martinez, female    DOB: Apr 18, 1946  Age: 71 y.o. MRN: [id number]    Subjective:  Subjective  HPI Michaela Martinez presents for f/u cholesterol and bp. She c/o swelling in her ankles that is always worse in the summer time.  No sob, no cp, no palpitations Review of Systems  Constitutional: Negative for activity change, appetite change, fatigue and unexpected weight change.  Respiratory: Negative for cough and shortness of breath.   Cardiovascular: Negative for chest pain and palpitations.  Psychiatric/Behavioral: Negative for behavioral problems and dysphoric mood. The patient is not nervous/anxious.     History Past Medical History:  Diagnosis Date  . Depression   . Hypertension   . Menopausal syndrome     She has a past surgical history that includes Inguinal hernia repair (2000) and Tubal ligation (1970).   Her family history includes Alcohol abuse in her unknown relative; Asthma in her son; Breast cancer in her maternal grandmother; Cirrhosis in her brother; Depression in her unknown relative; Diabetes in her unknown relative; Hypertension in her unknown relative; Liver disease in her unknown relative; Pancreatic cancer in her mother and unknown relative.She reports that she has never smoked. She has never used smokeless tobacco. She reports that she does not drink alcohol or use drugs.  Current Outpatient Prescriptions on File Prior to Visit  Medication Sig Dispense Refill  . triamcinolone cream (KENALOG) 0.5 % Apply 1 application topically 3 (three) times daily. 30 g 1  . atorvastatin (LIPITOR) 20 MG tablet Take 1 tablet (20 mg total) by mouth daily. (Patient not taking: Reported on 02/11/2017) 90 tablet 1  . traMADol (ULTRAM) 50 MG tablet TAKE ONE TABLET BY MOUTH EVERY 6 HOURS AS NEEDED FOR PAIN (Patient not taking: Reported on 02/11/2017) 60 tablet 0   No current facility-administered medications on file prior to visit.      Objective:  Objective  Physical Exam    Constitutional: She is oriented to person, place, and time. She appears well-developed and well-nourished.  HENT:  Head: Normocephalic and atraumatic.  Eyes: Conjunctivae and EOM are normal.  Neck: Normal range of motion. Neck supple. No JVD present. Carotid bruit is not present. No thyromegaly present.  Cardiovascular: Normal rate, regular rhythm and normal heart sounds.   No murmur heard. Pulmonary/Chest: Effort normal and breath sounds normal. No respiratory distress. She has no wheezes. She has no rales. She exhibits no tenderness.  Musculoskeletal: She exhibits edema.       Right ankle: She exhibits swelling.       Left ankle: She exhibits swelling.  Neurological: She is alert and oriented to person, place, and time.  Psychiatric: She has a normal mood and affect. Her behavior is normal. Judgment and thought content normal.  Nursing note and vitals reviewed.  BP 130/74 (BP Location: Right Arm, Patient Position: Sitting, Cuff Size: Normal)   Pulse 67   Temp 98.2 F (36.8 C) (Oral)   Ht 5\' 7"  (1.702 m)   Wt 261 lb 6.4 oz (118.6 kg)   SpO2 98%   BMI 40.94 kg/m  Wt Readings from Last 3 Encounters:  02/11/17 261 lb 6.4 oz (118.6 kg)  08/03/16 258 lb 6.4 oz (117.2 kg)  03/06/16 264 lb 6.4 oz (119.9 kg)     Lab Results  Component Value Date   WBC 9.2 08/03/2016   HGB 12.6 08/03/2016   HCT 39.4 08/03/2016   PLT 242.0 08/03/2016   GLUCOSE 95 02/11/2017   CHOL 179 02/11/2017  TRIG 87.0 02/11/2017   HDL 39.20 02/11/2017   LDLDIRECT 148.0 10/14/2010   LDLCALC 122 (H) 02/11/2017   ALT 11 02/11/2017   AST 14 02/11/2017   NA 141 02/11/2017   K 4.3 02/11/2017   CL 102 02/11/2017   CREATININE 0.88 02/11/2017   BUN 13 02/11/2017   CO2 33 (H) 02/11/2017   TSH 1.31 11/09/2008   HGBA1C 6.0 07/12/2014   MICROALBUR 0.8 11/15/2014    Dg Bone Density  Result Date: 10/09/2015 EXAM: DUAL X-RAY ABSORPTIOMETRY (DXA) FOR BONE MINERAL DENSITY IMPRESSION: Referring Physician:  Rosalita Chessman PATIENT: Name: Michaela Martinez, Michaela Martinez Patient ID: [id number] Birth Date: 1946-03-14 Height: 67.5 in. Sex: Female Measured: 10/09/2015 Weight: 255.6 lbs. Indications: Estrogen Deficient, Height Loss (781.91), Postmenopausal, Wellbutrin Fractures: Forearm Treatments: Vitamin D (E933.5) ASSESSMENT: The BMD measured at Femur Neck Right is 1.136 g/cm2 with a T-score of 0.7. This patient is considered NORMAL according to [facility]) criteria. There has been a statistically significant increase in BMD of left hip since prior exam dated 12/01/2012. Lumbar spine was not utilized due to advanced degenerative changes. Site Region Measured Date Measured Age [age]A BMD Significant CHANGE T-score DualFemur Neck Right 10/09/2015 68.1 0.7 1.136 g/cm2 Right Forearm Radius 33% 10/09/2015 68.1 1.6 1.015 g/cm2 World Health Organization [facility]) criteria for post-menopausal, Caucasian Women: Normal       T-score at or above -1 SD Osteopenia   T-score between -1 and -2.5 SD Osteoporosis T-score at or below -2.5 SD RECOMMENDATION: [place] recommends that FDA-approved medical therapies be considered in postmenopausal women and men age 51 or older with a: 1. Hip or vertebral (clinical or morphometric) fracture. 2. T-score of <-2.5 at the spine or hip. 3. Ten-year fracture probability by FRAX of 3% or greater for hip fracture or 20% or greater for major osteoporotic fracture. All treatment decisions require clinical judgment and consideration of individual patient factors, including patient preferences, co-morbidities, previous drug use, risk factors not captured in the FRAX model (e.g. falls, vitamin D deficiency, increased bone turnover, interval significant decline in bone density) and possible under - or over-estimation of fracture risk by FRAX. All patients should ensure an adequate intake of dietary calcium (1200 mg/d) and vitamin D (800 IU daily) unless contraindicated. FOLLOW-UP: People with  diagnosed cases of osteoporosis or at high risk for fracture should have regular bone mineral density tests. For patients eligible for Medicare, routine testing is allowed once every 2 years. The testing frequency can be increased to one year for patients who have rapidly progressing disease, those who are receiving or discontinuing medical therapy to restore bone mass, or have additional risk factors. I have reviewed this report, and agree with the above findings. Mark A. Thornton Papas, M.D. [facility] Electronically Signed   By: Lavonia Dana M.D.   On: 10/09/2015 15:34     Assessment & Plan:  Plan  I have discontinued Ms. Culbreth's Vitamin D, zoster vaccine live (PF), Tdap, Zoster Vac Recomb Adjuvanted, and metoprolol tartrate. I have also changed her metoprolol tartrate and sertraline. Additionally, I am having her start on Vitamin D. Lastly, I am having her maintain her traMADol, atorvastatin, triamcinolone cream, buPROPion, fluticasone, losartan, and spironolactone.  Meds ordered this encounter  Medications  . DISCONTD: spironolactone (ALDACTONE) 50 MG tablet    Sig: Take 1 tablet (50 mg total) by mouth daily.    Dispense:  90 tablet    Refill:  0  . Cholecalciferol (VITAMIN D) 2000 units CAPS  Sig: 1 po qd    Dispense:  30 capsule  . buPROPion (WELLBUTRIN XL) 300 MG 24 hr tablet    Sig: Take 1 tablet (300 mg total) by mouth daily.    Dispense:  90 tablet    Refill:  3  . fluticasone (FLONASE) 50 MCG/ACT nasal spray    Sig: Place 2 sprays into both nostrils daily.    Dispense:  16 g    Refill:  2  . losartan (COZAAR) 100 MG tablet    Sig: Take 1 tablet (100 mg total) by mouth daily.    Dispense:  90 tablet    Refill:  3  . metoprolol tartrate (LOPRESSOR) 100 MG tablet    Sig: Take 1 tablet (100 mg total) by mouth 2 (two) times daily.    Dispense:  180 tablet    Refill:  1  . sertraline (ZOLOFT) 100 MG tablet    Sig: Take 1 tablet (100 mg total) by mouth daily.    Dispense:  90  tablet    Refill:  1  . spironolactone (ALDACTONE) 50 MG tablet    Sig: Take 1 tablet (50 mg total) by mouth daily.    Dispense:  90 tablet    Refill:  1    Problem List Items Addressed This Visit      Unprioritized   Depression, major, single episode, mild (HCC)    Stable con't med      Relevant Medications   buPROPion (WELLBUTRIN XL) 300 MG 24 hr tablet   sertraline (ZOLOFT) 100 MG tablet   Edema, lower extremity    Pt felt the 50 mg spironolactone worked better than 25 mg 2 a day Will try to change back Pt will f/u      Relevant Medications   spironolactone (ALDACTONE) 50 MG tablet   Other Relevant Orders   Lipid panel (Completed)   Comprehensive metabolic panel (Completed)   Essential hypertension - Primary    Well controlled, no changes to meds. Encouraged heart healthy diet such as the DASH diet and exercise as tolerated.       Relevant Medications   losartan (COZAAR) 100 MG tablet   metoprolol tartrate (LOPRESSOR) 100 MG tablet   spironolactone (ALDACTONE) 50 MG tablet   Other Relevant Orders   Lipid panel (Completed)   Comprehensive metabolic panel (Completed)   Hyperlipidemia    Tolerating statin, encouraged heart healthy diet, avoid trans fats, minimize simple carbs and saturated fats. Increase exercise as tolerated      Relevant Medications   losartan (COZAAR) 100 MG tablet   metoprolol tartrate (LOPRESSOR) 100 MG tablet   spironolactone (ALDACTONE) 50 MG tablet   Other Relevant Orders   Lipid panel (Completed)   Comprehensive metabolic panel (Completed)    Other Visit Diagnoses    Estrogen deficiency       Relevant Medications   Cholecalciferol (VITAMIN D) 2000 units CAPS   Seasonal allergic rhinitis due to pollen       Relevant Medications   fluticasone (FLONASE) 50 MCG/ACT nasal spray   Need for prophylactic vaccination and inoculation against influenza       Relevant Orders   Flu vaccine HIGH DOSE PF (Fluzone High dose) (Completed)       Follow-up: Return in about 6 months (around 08/11/2017) for fasting, annual exam.  Ann Held, DO

## 2017-02-11 NOTE — Assessment & Plan Note (Signed)
Tolerating statin, encouraged heart healthy diet, avoid trans fats, minimize simple carbs and saturated fats. Increase exercise as tolerated 

## 2017-02-11 NOTE — Assessment & Plan Note (Signed)
Stable con't med 

## 2017-02-11 NOTE — Patient Instructions (Signed)

## 2017-02-11 NOTE — Assessment & Plan Note (Signed)
Pt felt the 50 mg spironolactone worked better than 25 mg 2 a day Will try to change back Pt will f/u

## 2017-02-11 NOTE — Assessment & Plan Note (Signed)
Elevate legs Compression socke Pt feels the 50 mg spironolactone worked better than 25mg  2 a day rto prn

## 2017-02-17 ENCOUNTER — Telehealth: Payer: Self-pay

## 2017-02-17 DIAGNOSIS — E785 Hyperlipidemia, unspecified: Secondary | ICD-10-CM

## 2017-02-17 MED ORDER — ATORVASTATIN CALCIUM 40 MG PO TABS: 40.0000 mg | ORAL_TABLET | Freq: Every evening | ORAL | 2 refills | Status: DC

## 2017-02-17 NOTE — Telephone Encounter (Signed)
-----   Message from Ann Held, DO sent at 02/14/2017  3:58 PM EDT ----- Cholesterol--- LDL goal < 100,  HDL >40,  TG < 150.  Diet and exercise will increase HDL and decrease LDL and TG.  Fish,  Fish Oil, Flaxseed oil will also help increase the HDL and decrease Triglycerides.   Recheck labs in 3 months--- inc lipitor to 40 mg #30  1 po qhs, 2 refills  Lipid, cmp.

## 2017-02-17 NOTE — Telephone Encounter (Signed)
Patient aware of lab results and agrees to increase Lipitor to 40mg  1qhs #30+3 faxed to [facility] per her req/she is scheduled for repeat CMP & Lipid on 12.12.18 and will be fasting/future order created/thx dmf

## 2017-05-19 ENCOUNTER — Other Ambulatory Visit: Payer: Medicare Other

## 2017-06-15 ENCOUNTER — Other Ambulatory Visit: Payer: Medicare Other

## 2017-07-05 ENCOUNTER — Other Ambulatory Visit (INDEPENDENT_AMBULATORY_CARE_PROVIDER_SITE_OTHER): Payer: Medicare Other

## 2017-07-05 DIAGNOSIS — E785 Hyperlipidemia, unspecified: Secondary | ICD-10-CM

## 2017-07-05 LAB — COMPREHENSIVE METABOLIC PANEL
ALT: 10 U/L (ref 0–35)
AST: 12 U/L (ref 0–37)
Albumin: 4 g/dL (ref 3.5–5.2)
Alkaline Phosphatase: 76 U/L (ref 39–117)
BUN: 13 mg/dL (ref 6–23)
CHLORIDE: 103 meq/L (ref 96–112)
CO2: 33 mEq/L — ABNORMAL HIGH (ref 19–32)
Calcium: 9.5 mg/dL (ref 8.4–10.5)
Creatinine, Ser: 1.03 mg/dL (ref 0.40–1.20)
GFR: 68.16 mL/min (ref >60.00)
GLUCOSE: 102 mg/dL — AB (ref 70–99)
POTASSIUM: 4.4 meq/L (ref 3.5–5.1)
SODIUM: 143 meq/L (ref 135–145)
Total Bilirubin: 0.6 mg/dL (ref 0.2–1.2)
Total Protein: 7.6 g/dL (ref 6.0–8.3)

## 2017-07-05 LAB — LIPID PANEL
CHOL/HDL RATIO: 4
Cholesterol: 168 mg/dL (ref 0–200)
HDL: 38.9 mg/dL — ABNORMAL LOW (ref >39.00)
LDL CALC: 111 mg/dL — AB (ref 0–99)
NONHDL: 129.07
Triglycerides: 92 mg/dL (ref 0.0–149.0)
VLDL: 18.4 mg/dL (ref 0.0–40.0)

## 2017-07-13 ENCOUNTER — Other Ambulatory Visit: Payer: Self-pay | Admitting: *Deleted

## 2017-07-13 DIAGNOSIS — I10 Essential (primary) hypertension: Secondary | ICD-10-CM

## 2017-07-13 DIAGNOSIS — E785 Hyperlipidemia, unspecified: Secondary | ICD-10-CM

## 2017-08-09 ENCOUNTER — Other Ambulatory Visit: Payer: Self-pay | Admitting: Family Medicine

## 2017-08-09 NOTE — Telephone Encounter (Signed)
Do you want patient on 300mg  per day or 450mg  per day.  Pharmacy is requesting refill on 450mg  per day.  You changed in September.  Please advised.

## 2017-08-10 NOTE — Telephone Encounter (Signed)
Please discuss with pt---  We have she is taking 300 mg daily

## 2017-08-10 NOTE — Telephone Encounter (Signed)
noted 

## 2017-08-10 NOTE — Telephone Encounter (Signed)
Patient states that she tried just the 300mg  and it was not enough.  She does better on the 450mg .  450mg  sent in

## 2017-09-21 ENCOUNTER — Ambulatory Visit: Payer: Self-pay

## 2017-09-21 DIAGNOSIS — R600 Localized edema: Secondary | ICD-10-CM

## 2017-09-21 DIAGNOSIS — I10 Essential (primary) hypertension: Secondary | ICD-10-CM

## 2017-09-21 MED ORDER — SPIRONOLACTONE 50 MG PO TABS: 50.0000 mg | ORAL_TABLET | Freq: Every day | ORAL | 1 refills | Status: DC

## 2017-09-21 NOTE — Addendum Note (Signed)
Addended by: Kem Boroughs D on: 09/21/2017 03:34 PM   Modules accepted: Orders

## 2017-09-21 NOTE — Telephone Encounter (Signed)
Pt. Reports she is not taking anything "for fluid now.My ankles get puffy while I'm up during the day. They go down at night when I'm laying down." Request "something for fluid be called in." Has an appointment for labs and OV for next week.  Answer Assessment - Initial Assessment Questions 1. LOCATION: "Which joint is swollen?"     Both ankles 2. ONSET: "When did the swelling start?"     For a long time 3. SIZE: "How large is the swelling?"     It's not tight 4. PAIN: "Is there any pain?" If so, ask: "How bad is it?" (Scale 1-10; or mild, moderate, severe)     No 5. CAUSE: "What do you think caused the swollen joint?"     Just fluid 6. OTHER SYMPTOMS: "Do you have any other symptoms?" (e.g., fever, chest pain, difficulty breathing, calf pain)     No chest pain or shortness of breath 7. PREGNANCY: "Is there any chance you are pregnant?" "When was your last menstrual period?"     No  Protocols used: ANKLE SWELLING-A-AH

## 2017-09-21 NOTE — Telephone Encounter (Signed)
rx refilled and patient notified to keep appt

## 2017-09-28 ENCOUNTER — Other Ambulatory Visit (INDEPENDENT_AMBULATORY_CARE_PROVIDER_SITE_OTHER): Payer: Medicare Other

## 2017-09-28 DIAGNOSIS — I10 Essential (primary) hypertension: Secondary | ICD-10-CM

## 2017-09-28 DIAGNOSIS — E785 Hyperlipidemia, unspecified: Secondary | ICD-10-CM

## 2017-09-28 LAB — COMPREHENSIVE METABOLIC PANEL
ALT: 14 U/L (ref 0–35)
AST: 15 U/L (ref 0–37)
Albumin: 3.7 g/dL (ref 3.5–5.2)
Alkaline Phosphatase: 71 U/L (ref 39–117)
BUN: 17 mg/dL (ref 6–23)
CHLORIDE: 102 meq/L (ref 96–112)
CO2: 33 mEq/L — ABNORMAL HIGH (ref 19–32)
Calcium: 9.2 mg/dL (ref 8.4–10.5)
Creatinine, Ser: 0.94 mg/dL (ref 0.40–1.20)
GFR: 75.69 mL/min (ref >60.00)
GLUCOSE: 88 mg/dL (ref 70–99)
POTASSIUM: 4.2 meq/L (ref 3.5–5.1)
SODIUM: 139 meq/L (ref 135–145)
Total Bilirubin: 0.4 mg/dL (ref 0.2–1.2)
Total Protein: 7.1 g/dL (ref 6.0–8.3)

## 2017-09-28 LAB — LIPID PANEL
Cholesterol: 168 mg/dL (ref 0–200)
HDL: 44.7 mg/dL (ref >39.00)
LDL CALC: 104 mg/dL — AB (ref 0–99)
NONHDL: 123.31
Total CHOL/HDL Ratio: 4
Triglycerides: 99 mg/dL (ref 0.0–149.0)
VLDL: 19.8 mg/dL (ref 0.0–40.0)

## 2017-09-30 ENCOUNTER — Ambulatory Visit: Payer: Medicare Other | Admitting: Family Medicine

## 2017-09-30 ENCOUNTER — Encounter: Payer: Self-pay | Admitting: Family Medicine

## 2017-09-30 VITALS — BP 152/80 | HR 75 | Temp 98.4°F | Resp 16 | Ht 67.0 in | Wt 266.2 lb

## 2017-09-30 DIAGNOSIS — K635 Polyp of colon: Secondary | ICD-10-CM

## 2017-09-30 DIAGNOSIS — E785 Hyperlipidemia, unspecified: Secondary | ICD-10-CM

## 2017-09-30 DIAGNOSIS — I10 Essential (primary) hypertension: Secondary | ICD-10-CM | POA: Diagnosis not present

## 2017-09-30 DIAGNOSIS — R601 Generalized edema: Secondary | ICD-10-CM

## 2017-09-30 DIAGNOSIS — R600 Localized edema: Secondary | ICD-10-CM | POA: Diagnosis not present

## 2017-09-30 MED ORDER — METOPROLOL SUCCINATE ER 100 MG PO TB24: 100.0000 mg | ORAL_TABLET | Freq: Every day | ORAL | 2 refills | Status: DC

## 2017-09-30 MED ORDER — ROSUVASTATIN CALCIUM 10 MG PO TABS
10.0000 mg | ORAL_TABLET | Freq: Every day | ORAL | 1 refills | Status: DC
Start: 2017-09-30 — End: 2017-12-28

## 2017-09-30 NOTE — Progress Notes (Signed)
Patient ID: Michaela Martinez, female   DOB: 1946/04/13, 72 y.o.   MRN: [id number]     Subjective:  I acted as a Education administrator for Dr. Carollee Herter.  Guerry Bruin, [place]   Patient ID: Michaela Martinez, female    DOB: [birth year], 72 y.o.   MRN: [id number]  Chief Complaint  Patient presents with  . Hypertension    HPI  Patient is in today for follow up blood pressure and ankle and both feet are swollen.  She has been off of her cholesterol about a month now.  She states that she were have weird dreams.  She restarted her diuretic about 10 days ago and swelling had gone down.  She also admits to sometimes forgetting the second dose of metoprolol.   Patient Care Team: Carollee Herter, Alferd Apa, DO as PCP - General   Past Medical History:  Diagnosis Date  . Depression   . Hypertension   . Menopausal syndrome     Past Surgical History:  Procedure Laterality Date  . INGUINAL HERNIA REPAIR  2000  . TUBAL LIGATION  1970    Family History  Problem Relation Age of Onset  . Pancreatic cancer Mother   . Cirrhosis Brother   . Diabetes Unknown   . Hypertension Unknown   . Liver disease Unknown   . Pancreatic cancer Unknown   . Alcohol abuse Unknown   . Depression Unknown   . Asthma Son   . Breast cancer Maternal Grandmother     Social History   Socioeconomic History  . Marital status: Married    Spouse name: Not on file  . Number of children: Not on file  . Years of education: Not on file  . Highest education level: Not on file  Occupational History  . Not on file  Social Needs  . Financial resource strain: Not on file  . Food insecurity:    Worry: Not on file    Inability: Not on file  . Transportation needs:    Medical: Not on file    Non-medical: Not on file  Tobacco Use  . Smoking status: Never Smoker  . Smokeless tobacco: Never Used  Substance and Sexual Activity  . Alcohol use: No  . Drug use: No  . Sexual activity: Never  Lifestyle  . Physical activity:    Days per week: Not on file     Minutes per session: Not on file  . Stress: Not on file  Relationships  . Social connections:    Talks on phone: Not on file    Gets together: Not on file    Attends religious service: Not on file    Active member of club or organization: Not on file    Attends meetings of clubs or organizations: Not on file    Relationship status: Not on file  . Intimate partner violence:    Fear of current or ex partner: Not on file    Emotionally abused: Not on file    Physically abused: Not on file    Forced sexual activity: Not on file  Other Topics Concern  . Not on file  Social History Narrative  . Not on file    Outpatient Medications Prior to Visit  Medication Sig Dispense Refill  . buPROPion (WELLBUTRIN XL) 150 MG 24 hr tablet TAKE THREE TABLETS BY MOUTH ONCE DAILY 270 tablet 0  . Cholecalciferol (VITAMIN D) 2000 units CAPS 1 po qd 30 capsule   . fluticasone (FLONASE) 50 MCG/ACT nasal spray Place  2 sprays into both nostrils daily. 16 g 2  . losartan (COZAAR) 100 MG tablet Take 1 tablet (100 mg total) by mouth daily. 90 tablet 3  . sertraline (ZOLOFT) 100 MG tablet Take 1 tablet (100 mg total) by mouth daily. 90 tablet 1  . spironolactone (ALDACTONE) 50 MG tablet Take 1 tablet (50 mg total) by mouth daily. 90 tablet 1  . traMADol (ULTRAM) 50 MG tablet TAKE ONE TABLET BY MOUTH EVERY 6 HOURS AS NEEDED FOR PAIN 60 tablet 0  . triamcinolone cream (KENALOG) 0.5 % Apply 1 application topically 3 (three) times daily. 30 g 1  . metoprolol tartrate (LOPRESSOR) 100 MG tablet Take 1 tablet (100 mg total) by mouth 2 (two) times daily. 180 tablet 1  . atorvastatin (LIPITOR) 40 MG tablet Take 1 tablet (40 mg total) by mouth every evening. (Patient not taking: Reported on 09/30/2017) 30 tablet 2   No facility-administered medications prior to visit.     Allergies  Allergen Reactions  . Norvasc [Amlodipine Besylate] Other (See Comments)    Makes  Me feel crazy and dizziness  . Lipitor  [Atorvastatin Calcium] Other (See Comments)    Night mares    Review of Systems  Constitutional: Negative for fever and malaise/fatigue.  HENT: Negative for congestion.   Eyes: Negative for blurred vision.  Respiratory: Negative for cough and shortness of breath.   Cardiovascular: Positive for leg swelling. Negative for chest pain and palpitations.       Both feet are swollen.  Gastrointestinal: Negative for vomiting.  Musculoskeletal: Negative for back pain.  Skin: Negative for rash.  Neurological: Negative for loss of consciousness and headaches.       Objective:    Physical Exam  Constitutional: She is oriented to person, place, and time. She appears well-developed and well-nourished.  HENT:  Head: Normocephalic and atraumatic.  Eyes: Conjunctivae and EOM are normal.  Neck: Normal range of motion. Neck supple. No JVD present. Carotid bruit is not present. No thyromegaly present.  Cardiovascular: Normal rate, regular rhythm and normal heart sounds.  No murmur heard. Pulmonary/Chest: Effort normal and breath sounds normal. No respiratory distress. She has no wheezes. She has no rales. She exhibits no tenderness.  Musculoskeletal: She exhibits edema.  +1 pitting edema b/l low ext -- improved per pt   Neurological: She is alert and oriented to person, place, and time.  Psychiatric: She has a normal mood and affect.  Nursing note and vitals reviewed.   BP (!) 152/80   Pulse 75   Temp 98.4 F (36.9 C) (Oral)   Resp 16   Ht 5\' 7"  (1.702 m)   Wt 266 lb 3.2 oz (120.7 kg)   SpO2 98%   BMI 41.69 kg/m  Wt Readings from Last 3 Encounters:  09/30/17 266 lb 3.2 oz (120.7 kg)  02/11/17 261 lb 6.4 oz (118.6 kg)  08/03/16 258 lb 6.4 oz (117.2 kg)   BP Readings from Last 3 Encounters:  09/30/17 (!) 152/80  02/11/17 130/74  08/03/16 (!) 178/80     Immunization History  Administered Date(s) Administered  . Influenza, High Dose Seasonal PF 02/11/2017  . Influenza,inj,Quad  PF,6+ Mos 04/12/2013, 07/12/2014, 08/03/2016  . Pneumococcal Conjugate-13 07/12/2014  . Pneumococcal Polysaccharide-23 10/06/2012  . Td 06/09/2003  . Tdap 10/08/2015  . Zoster 10/08/2015  . Zoster Recombinat (Shingrix) 02/11/2017    Health Maintenance  Topic Date Due  . COLONOSCOPY  01/04/2013  . MAMMOGRAM  08/29/2017  . INFLUENZA VACCINE  01/06/2018  . TETANUS/TDAP  10/07/2025  . DEXA SCAN  Completed  . Hepatitis C Screening  Completed  . PNA vac Low Risk Adult  Completed    Lab Results  Component Value Date   WBC 9.2 08/03/2016   HGB 12.6 08/03/2016   HCT 39.4 08/03/2016   PLT 242.0 08/03/2016   GLUCOSE 88 09/28/2017   CHOL 168 09/28/2017   TRIG 99.0 09/28/2017   HDL 44.70 09/28/2017   LDLDIRECT 148.0 10/14/2010   LDLCALC 104 (H) 09/28/2017   ALT 14 09/28/2017   AST 15 09/28/2017   NA 139 09/28/2017   K 4.2 09/28/2017   CL 102 09/28/2017   CREATININE 0.94 09/28/2017   BUN 17 09/28/2017   CO2 33 (H) 09/28/2017   TSH 1.31 11/09/2008   HGBA1C 6.0 07/12/2014   MICROALBUR 0.8 11/15/2014    Lab Results  Component Value Date   TSH 1.31 11/09/2008   Lab Results  Component Value Date   WBC 9.2 08/03/2016   HGB 12.6 08/03/2016   HCT 39.4 08/03/2016   MCV 84.1 08/03/2016   PLT 242.0 08/03/2016   Lab Results  Component Value Date   NA 139 09/28/2017   K 4.2 09/28/2017   CO2 33 (H) 09/28/2017   GLUCOSE 88 09/28/2017   BUN 17 09/28/2017   CREATININE 0.94 09/28/2017   BILITOT 0.4 09/28/2017   ALKPHOS 71 09/28/2017   AST 15 09/28/2017   ALT 14 09/28/2017   PROT 7.1 09/28/2017   ALBUMIN 3.7 09/28/2017   CALCIUM 9.2 09/28/2017   GFR 75.69 09/28/2017   Lab Results  Component Value Date   CHOL 168 09/28/2017   Lab Results  Component Value Date   HDL 44.70 09/28/2017   Lab Results  Component Value Date   LDLCALC 104 (H) 09/28/2017   Lab Results  Component Value Date   TRIG 99.0 09/28/2017   Lab Results  Component Value Date   CHOLHDL 4 09/28/2017    Lab Results  Component Value Date   HGBA1C 6.0 07/12/2014         Assessment & Plan:   Problem List Items Addressed This Visit      Unprioritized   EDEMA    Take diuretic daily Elevate legs Compression socks      Edema, lower extremity    Elevate legs Take diuretic daily Compression socks       Essential hypertension    Poorly controlled will alter medications, encouraged DASH diet, minimize caffeine and obtain adequate sleep. Report concerning symptoms and follow up as directed and as needed--- pt was not taking meds regularly and forget second dose of metoprolol at times Change to toprol qd  Recheck 2-3 weeks       Relevant Medications   rosuvastatin (CRESTOR) 10 MG tablet   metoprolol succinate (TOPROL-XL) 100 MG 24 hr tablet   Hyperlipidemia    Tolerating statin, encouraged heart healthy diet, avoid trans fats, minimize simple carbs and saturated fats. Increase exercise as tolerated      Relevant Medications   rosuvastatin (CRESTOR) 10 MG tablet   metoprolol succinate (TOPROL-XL) 100 MG 24 hr tablet    Other Visit Diagnoses    Hyperlipidemia LDL goal <100    -  Primary   Relevant Medications   rosuvastatin (CRESTOR) 10 MG tablet   metoprolol succinate (TOPROL-XL) 100 MG 24 hr tablet   Lower extremity edema       Polyp of colon, unspecified part of colon, unspecified type  Relevant Orders   Ambulatory referral to Gastroenterology      I have discontinued Bryce Malveaux's metoprolol tartrate and atorvastatin. I am also having her start on rosuvastatin and metoprolol succinate. Additionally, I am having her maintain her traMADol, triamcinolone cream, Vitamin D, fluticasone, losartan, sertraline, buPROPion, and spironolactone.  Meds ordered this encounter  Medications  . rosuvastatin (CRESTOR) 10 MG tablet    Sig: Take 1 tablet (10 mg total) by mouth daily.    Dispense:  30 tablet    Refill:  1  . metoprolol succinate (TOPROL-XL) 100 MG 24 hr tablet     Sig: Take 1 tablet (100 mg total) by mouth daily. Take with or immediately following a meal.    Dispense:  30 tablet    Refill:  2    CMA served as scribe during this visit. History, Physical and Plan performed by medical provider. Documentation and orders reviewed and attested to.  Ann Held, DO

## 2017-09-30 NOTE — Assessment & Plan Note (Signed)
Tolerating statin, encouraged heart healthy diet, avoid trans fats, minimize simple carbs and saturated fats. Increase exercise as tolerated 

## 2017-09-30 NOTE — Patient Instructions (Signed)

## 2017-09-30 NOTE — Assessment & Plan Note (Signed)
Poorly controlled will alter medications, encouraged DASH diet, minimize caffeine and obtain adequate sleep. Report concerning symptoms and follow up as directed and as needed--- pt was not taking meds regularly and forget second dose of metoprolol at times Change to toprol qd  Recheck 2-3 weeks

## 2017-09-30 NOTE — Assessment & Plan Note (Signed)
Take diuretic daily Elevate legs Compression socks

## 2017-09-30 NOTE — Assessment & Plan Note (Signed)
Elevate legs Take diuretic daily Compression socks

## 2017-11-30 ENCOUNTER — Other Ambulatory Visit: Payer: Self-pay | Admitting: Family Medicine

## 2017-12-28 ENCOUNTER — Other Ambulatory Visit: Payer: Self-pay | Admitting: Family Medicine

## 2017-12-28 DIAGNOSIS — E785 Hyperlipidemia, unspecified: Secondary | ICD-10-CM

## 2018-01-11 ENCOUNTER — Telehealth: Payer: Self-pay | Admitting: Family Medicine

## 2018-01-11 NOTE — Telephone Encounter (Signed)
Copied from [place] [phone]. Topic: General - Other >> Jan 11, 2018  1:18 PM Cecelia Byars, NT wrote: Reason for [id number] is called to request labs ,there is no order please cal her at [phone] to schedule

## 2018-01-13 NOTE — Telephone Encounter (Signed)
Left message on machine that from ov in April, Dr Etter Sjogren wanted to see her back in 6 months.  She can schedule and ov for October.

## 2018-01-14 ENCOUNTER — Ambulatory Visit (INDEPENDENT_AMBULATORY_CARE_PROVIDER_SITE_OTHER): Payer: Medicare Other | Admitting: Family Medicine

## 2018-01-14 ENCOUNTER — Encounter: Payer: Self-pay | Admitting: Family Medicine

## 2018-01-14 VITALS — BP 142/86 | HR 83 | Temp 98.4°F | Ht 67.0 in | Wt 264.0 lb

## 2018-01-14 DIAGNOSIS — M7989 Other specified soft tissue disorders: Secondary | ICD-10-CM | POA: Diagnosis not present

## 2018-01-14 LAB — COMPREHENSIVE METABOLIC PANEL
ALK PHOS: 76 U/L (ref 39–117)
ALT: 10 U/L (ref 0–35)
AST: 11 U/L (ref 0–37)
Albumin: 4.2 g/dL (ref 3.5–5.2)
BILIRUBIN TOTAL: 0.4 mg/dL (ref 0.2–1.2)
BUN: 14 mg/dL (ref 6–23)
CO2: 31 mEq/L (ref 19–32)
CREATININE: 1.09 mg/dL (ref 0.40–1.20)
Calcium: 9.7 mg/dL (ref 8.4–10.5)
Chloride: 103 mEq/L (ref 96–112)
GFR: 63.75 mL/min (ref >60.00)
GLUCOSE: 90 mg/dL (ref 70–99)
Potassium: 4.8 mEq/L (ref 3.5–5.1)
SODIUM: 141 meq/L (ref 135–145)
TOTAL PROTEIN: 7.3 g/dL (ref 6.0–8.3)

## 2018-01-14 LAB — TSH: TSH: 1.67 u[IU]/mL (ref 0.35–4.50)

## 2018-01-14 NOTE — Progress Notes (Signed)
Chief Complaint  Patient presents with  . Ankle Pain    Darra Lis here for bilateral leg swelling.  Duration: 4 months  Spironolactone not helpful.  Hx of prolonged bedrest, recent surgery, travel or injury? No Pain the calf? No SOB? No Personal or family history of clot or bleeding disorder? No Hx of heart failure, renal failure, hepatic failure? No  ROS:  MSK- +leg swelling, no pain Lungs- no SOB  Past Medical History:  Diagnosis Date  . Depression   . Hypertension   . Menopausal syndrome    Family History  Problem Relation Age of Onset  . Pancreatic cancer Mother   . Cirrhosis Brother   . Diabetes Unknown   . Hypertension Unknown   . Liver disease Unknown   . Pancreatic cancer Unknown   . Alcohol abuse Unknown   . Depression Unknown   . Asthma Son   . Breast cancer Maternal Grandmother    Past Surgical History:  Procedure Laterality Date  . INGUINAL HERNIA REPAIR  2000  . TUBAL LIGATION  1970     BP (!) 142/86 (BP Location: Left Arm, Patient Position: Sitting, Cuff Size: Large)   Pulse 83   Temp 98.4 F (36.9 C) (Oral)   Ht 5\' 7"  (1.702 m)   Wt 264 lb (119.7 kg)   SpO2 93%   BMI 41.35 kg/m  Gen- awake, alert, appears stated age Heart- RRR, no murmurs, 3+ pitting LE edema in Feet and LE's up to knees b/l Lungs- CTAB, normal effort w/o accessory muscle use MSK- no calf pain Psych: Age appropriate judgment and insight  Localized swelling of both lower extremities - Plan: Comprehensive metabolic panel, TSH  Orders as above. F/u prn. Pt voiced understanding and agreement to the plan.  [place], DO 01/14/18  9:45 AM

## 2018-01-14 NOTE — Progress Notes (Signed)
Pre visit review using our clinic review tool, if applicable. No additional management support is needed unless otherwise documented below in the visit note. 

## 2018-01-14 NOTE — Patient Instructions (Signed)
Stay physically active, elevate your legs, use compression stockings, mind the salt intake.  Give [place] 2-3 business days to get the results of your labs back.   Let us know if you need anything.

## 2018-01-25 ENCOUNTER — Other Ambulatory Visit: Payer: Self-pay | Admitting: Family Medicine

## 2018-01-25 DIAGNOSIS — E785 Hyperlipidemia, unspecified: Secondary | ICD-10-CM

## 2018-01-28 ENCOUNTER — Ambulatory Visit: Payer: Self-pay | Admitting: Family Medicine

## 2018-01-28 ENCOUNTER — Telehealth: Payer: Self-pay | Admitting: Family Medicine

## 2018-01-28 NOTE — Telephone Encounter (Signed)
Pt. Called to report she has swelling of the tops of feet and ankles; left > right.  Stated she has been closely monitoring her salt intake and has decreased this.  Also is wearing compression stockings, and elevating her feet/ legs as recommended.  Reported the swelling of her legs has gone down.  Denied any shortness of breath or chest pain.  Denied any swelling of upper extremities or face/ chest.  Denied any redness or warmth of LE's.  Was seen 01/14/18 and recommended to return to office in 4 weeks.  Scheduled 4 week f/u appt. On 02/17/18.  Instructed on continuing recommendation of compression stockings, reducing sodium intake, and in elevating LE's above level of heart at intervals.  Care advice give per protocol.  Verb. Understanding and agrees with plan.   Reason for Disposition . [1] MILD swelling of both ankles (i.e., pedal edema) AND [2] is a chronic symptom (recurrent or ongoing AND present > 4 weeks)    Was evaluated on 01/14/18 for bilat. LE swelling and advised to return in 4 weeks to reevaluate swelling.  Answer Assessment - Initial Assessment Questions 1. ONSET: "When did the swelling start?" (e.g., minutes, hours, days)     Swelling has been going on for awhile 2. LOCATION: "What part of the leg is swollen?"  "Are both legs swollen or just one leg?"     Top of feet and ankles bilaterally; left > right  3. SEVERITY: "How bad is the swelling?" (e.g., localized; mild, moderate, severe)  - Localized - small area of swelling localized to one leg  - MILD pedal edema - swelling limited to foot and ankle, pitting edema < 1/4 inch (6 mm) deep, rest and elevation eliminate most or all swelling  - MODERATE edema - swelling of lower leg to knee, pitting edema > 1/4 inch (6 mm) deep, rest and elevation only partially reduce swelling  - SEVERE edema - swelling extends above knee, facial or hand swelling present     moderate 4. REDNESS: "Does the swelling look red or infected?"     Denied redness or  warmth 5. PAIN: "Is the swelling painful to touch?" If so, ask: "How painful is it?"   (Scale 1-10; mild, moderate or severe)     denied 6. FEVER: "Do you have a fever?" If so, ask: "What is it, how was it measured, and when did it start?"      denied 7. CAUSE: "What do you think is causing the leg swelling?"     It has been ongoing  8. MEDICAL HISTORY: "Do you have a history of heart failure, kidney disease, liver failure, or cancer?"     Denied  9. RECURRENT SYMPTOM: "Have you had leg swelling before?" If so, ask: "When was the last time?" "What happened that time?"     Yes; was placed on fluid pill 10. OTHER SYMPTOMS: "Do you have any other symptoms?" (e.g., chest pain, difficulty breathing)       Denied shortness of breath, chest pain  11. PREGNANCY: "Is there any chance you are pregnant?" "When was your last menstrual period?"       n/a  Protocols used: LEG SWELLING AND EDEMA-A-AH

## 2018-01-28 NOTE — Telephone Encounter (Signed)
Copied from [place]. Topic: Quick Communication - Rx Refill/Question >> Jan 28, 2018  1:19 PM Burchel, Abbi R wrote: Medication: buPROPion (WELLBUTRIN XL) 150 MG 24 hr tablet  Pt states this rx is not working well anymore. She would like to discuss another option. Please advise.    [phone]

## 2018-01-28 NOTE — Telephone Encounter (Signed)
We can increase to 300 mg ?

## 2018-02-02 MED ORDER — BUPROPION HCL ER (XL) 300 MG PO TB24: 300.0000 mg | ORAL_TABLET | Freq: Every day | ORAL | 1 refills | Status: DC

## 2018-02-02 NOTE — Telephone Encounter (Signed)
Patient notified and is willing to try 300mg .  Advised she can take 2 tabs of what she has now and new rx will be at pharmacy for new dose.

## 2018-02-17 ENCOUNTER — Ambulatory Visit (HOSPITAL_BASED_OUTPATIENT_CLINIC_OR_DEPARTMENT_OTHER)
Admission: RE | Admit: 2018-02-17 | Discharge: 2018-02-17 | Disposition: A | Discharge status: Home / Self Care | Payer: Medicare Other | Source: Ambulatory Visit | Attending: Family Medicine | Admitting: Family Medicine

## 2018-02-17 ENCOUNTER — Other Ambulatory Visit: Payer: Self-pay | Admitting: Family Medicine

## 2018-02-17 ENCOUNTER — Ambulatory Visit (INDEPENDENT_AMBULATORY_CARE_PROVIDER_SITE_OTHER): Payer: Medicare Other | Admitting: Family Medicine

## 2018-02-17 ENCOUNTER — Encounter: Payer: Self-pay | Admitting: Family Medicine

## 2018-02-17 VITALS — BP 162/73 | HR 65 | Temp 98.0°F | Resp 16 | Ht 67.0 in | Wt 265.6 lb

## 2018-02-17 DIAGNOSIS — I10 Essential (primary) hypertension: Secondary | ICD-10-CM

## 2018-02-17 DIAGNOSIS — M79662 Pain in left lower leg: Principal | ICD-10-CM

## 2018-02-17 DIAGNOSIS — F320 Major depressive disorder, single episode, mild: Secondary | ICD-10-CM

## 2018-02-17 DIAGNOSIS — F321 Major depressive disorder, single episode, moderate: Secondary | ICD-10-CM | POA: Diagnosis not present

## 2018-02-17 DIAGNOSIS — Z23 Encounter for immunization: Secondary | ICD-10-CM | POA: Diagnosis not present

## 2018-02-17 DIAGNOSIS — E785 Hyperlipidemia, unspecified: Secondary | ICD-10-CM

## 2018-02-17 DIAGNOSIS — J301 Allergic rhinitis due to pollen: Secondary | ICD-10-CM

## 2018-02-17 DIAGNOSIS — R600 Localized edema: Secondary | ICD-10-CM | POA: Diagnosis not present

## 2018-02-17 DIAGNOSIS — M79661 Pain in right lower leg: Secondary | ICD-10-CM

## 2018-02-17 DIAGNOSIS — M7989 Other specified soft tissue disorders: Secondary | ICD-10-CM

## 2018-02-17 MED ORDER — BUPROPION HCL ER (XL) 300 MG PO TB24: 300.0000 mg | ORAL_TABLET | Freq: Every day | ORAL | 3 refills | Status: DC

## 2018-02-17 MED ORDER — SPIRONOLACTONE 50 MG PO TABS: 50.0000 mg | ORAL_TABLET | Freq: Two times a day (BID) | ORAL | 3 refills | Status: DC

## 2018-02-17 MED ORDER — ESCITALOPRAM OXALATE 10 MG PO TABS: 10.0000 mg | ORAL_TABLET | Freq: Every day | ORAL | 2 refills | Status: DC

## 2018-02-17 MED ORDER — METOPROLOL SUCCINATE ER 100 MG PO TB24: 100.0000 mg | ORAL_TABLET | Freq: Every day | ORAL | 1 refills | Status: DC

## 2018-02-17 MED ORDER — ROSUVASTATIN CALCIUM 10 MG PO TABS: ORAL_TABLET | ORAL | Status: DC

## 2018-02-17 MED ORDER — LOSARTAN POTASSIUM 100 MG PO TABS: 100.0000 mg | ORAL_TABLET | Freq: Every day | ORAL | 3 refills | Status: DC

## 2018-02-17 MED ORDER — FLUTICASONE PROPIONATE 50 MCG/ACT NA SUSP: 2.0000 | Freq: Every day | NASAL | 2 refills | Status: DC

## 2018-02-17 NOTE — Assessment & Plan Note (Signed)
Well controlled, no changes to meds. Encouraged heart healthy diet such as the DASH diet and exercise as tolerated.  °

## 2018-02-17 NOTE — Patient Instructions (Signed)
Edema Edema is an abnormal buildup of fluids in your bodytissues. Edema is somewhatdependent on gravity to pull the fluid to the lowest place in your body. That makes the condition more common in the legs and thighs (lower extremities). Painless swelling of the feet and ankles is common and becomes more likely as you get older. It is also common in looser tissues, like around your eyes. When the affected area is squeezed, the fluid may move out of that spot and leave a dent for a few moments. This dent is called pitting. What are the causes? There are many possible causes of edema. Eating too much salt and being on your feet or sitting for a long time can cause edema in your legs and ankles. Hot weather may make edema worse. Common medical causes of edema include:  Heart failure.  Liver disease.  Kidney disease.  Weak blood vessels in your legs.  Cancer.  An injury.  Pregnancy.  Some medications.  Obesity.  What are the signs or symptoms? Edema is usually painless.Your skin may look swollen or shiny. How is this diagnosed? Your health care provider may be able to diagnose edema by asking about your medical history and doing a physical exam. You may need to have tests such as X-rays, an electrocardiogram, or blood tests to check for medical conditions that may cause edema. How is this treated? Edema treatment depends on the cause. If you have heart, liver, or kidney disease, you need the treatment appropriate for these conditions. General treatment may include:  Elevation of the affected body part above the level of your heart.  Compression of the affected body part. Pressure from elastic bandages or support stockings squeezes the tissues and forces fluid back into the blood vessels. This keeps fluid from entering the tissues.  Restriction of fluid and salt intake.  Use of a water pill (diuretic). These medications are appropriate only for some types of edema. They pull fluid  out of your body and make you urinate more often. This gets rid of fluid and reduces swelling, but diuretics can have side effects. Only use diuretics as directed by your health care provider.  Follow these instructions at home:  Keep the affected body part above the level of your heart when you are lying down.  Do not sit still or stand for prolonged periods.  Do not put anything directly under your knees when lying down.  Do not wear constricting clothing or garters on your upper legs.  Exercise your legs to work the fluid back into your blood vessels. This may help the swelling go down.  Wear elastic bandages or support stockings to reduce ankle swelling as directed by your health care provider.  Eat a low-salt diet to reduce fluid if your health care provider recommends it.  Only take medicines as directed by your health care provider. Contact a health care provider if:  Your edema is not responding to treatment.  You have heart, liver, or kidney disease and notice symptoms of edema.  You have edema in your legs that does not improve after elevating them.  You have sudden and unexplained weight gain. Get help right away if:  You develop shortness of breath or chest pain.  You cannot breathe when you lie down.  You develop pain, redness, or warmth in the swollen areas.  You have heart, liver, or kidney disease and suddenly get edema.  You have a fever and your symptoms suddenly get worse. This information is   not intended to replace advice given to you by your health care provider. Make sure you discuss any questions you have with your health care provider. Document Released: 05/25/2005 Document Revised: 10/31/2015 Document Reviewed: 03/17/2013 Elsevier Interactive Patient Education  2017 [employer]  

## 2018-02-17 NOTE — Assessment & Plan Note (Signed)
Tolerating statin, encouraged heart healthy diet, avoid trans fats, minimize simple carbs and saturated fats. Increase exercise as tolerated 

## 2018-02-17 NOTE — Progress Notes (Signed)
Patient ID: Michaela Martinez, female    DOB: Oct 20, 1945  Age: 72 y.o. MRN: [id number]    Subjective:  Subjective  HPI Michaela Martinez presents for f/u low ext edema.  She is teary eyed today and frustrated because her legs hurt  L >R   No chest pain or sob.    Review of Systems  Constitutional: Negative for activity change, appetite change, chills and fever.  HENT: Negative for congestion and hearing loss.   Eyes: Negative for discharge.  Respiratory: Negative for cough and shortness of breath.   Cardiovascular: Positive for leg swelling. Negative for chest pain and palpitations.  Gastrointestinal: Negative for abdominal distention, abdominal pain, blood in stool, constipation, diarrhea, nausea and vomiting.  Genitourinary: Negative for difficulty urinating, dyspareunia, dysuria, flank pain, frequency, genital sores, hematuria, menstrual problem, pelvic pain, urgency, vaginal discharge and vaginal pain.  Musculoskeletal: Negative for back pain and myalgias.  Skin: Negative for rash.  Allergic/Immunologic: Negative for environmental allergies.  Neurological: Negative for dizziness, weakness and headaches.  Hematological: Does not bruise/bleed easily.  Psychiatric/Behavioral: Negative for suicidal ideas. The patient is not nervous/anxious.     History Past Medical History:  Diagnosis Date  . Depression   . Hypertension   . Menopausal syndrome     She has a past surgical history that includes Inguinal hernia repair (2000) and Tubal ligation (1970).   Her family history includes Alcohol abuse in her unknown relative; Asthma in her son; Breast cancer in her maternal grandmother; Cirrhosis in her brother; Depression in her unknown relative; Diabetes in her unknown relative; Hypertension in her unknown relative; Liver disease in her unknown relative; Pancreatic cancer in her mother and unknown relative.She reports that she has never smoked. She has never used smokeless tobacco. She reports that she  does not drink alcohol or use drugs.  Current Outpatient Medications on File Prior to Visit  Medication Sig Dispense Refill  . Cholecalciferol (VITAMIN D) 2000 units CAPS 1 po qd 30 capsule   . triamcinolone cream (KENALOG) 0.5 % Apply 1 application topically 3 (three) times daily. 30 g 1   No current facility-administered medications on file prior to visit.      Objective:  Objective  Physical Exam  Constitutional: She is oriented to person, place, and time. She appears well-developed and well-nourished.  HENT:  Head: Normocephalic and atraumatic.  Eyes: Conjunctivae and EOM are normal.  Neck: Normal range of motion. Neck supple. No JVD present. Carotid bruit is not present. No thyromegaly present.  Cardiovascular: Normal rate, regular rhythm and normal heart sounds.  No murmur heard. Pulmonary/Chest: Effort normal and breath sounds normal. No respiratory distress. She has no wheezes. She has no rales. She exhibits no tenderness.  Musculoskeletal: She exhibits edema.  + pain in L calf and front of leg + swelling b/l L >R   Neurological: She is alert and oriented to person, place, and time.  Psychiatric: Her speech is normal and behavior is normal. Cognition and memory are normal. She exhibits a depressed mood.  Nursing note and vitals reviewed.  BP (!) 162/73 (BP Location: Left Arm, Patient Position: Sitting, Cuff Size: Large)   Pulse 65   Temp 98 F (36.7 C) (Oral)   Resp 16   Ht 5\' 7"  (1.702 m)   Wt 265 lb 9.6 oz (120.5 kg)   SpO2 100%   BMI 41.60 kg/m  Wt Readings from Last 3 Encounters:  02/17/18 265 lb 9.6 oz (120.5 kg)  01/14/18 264 lb (119.7 kg)  09/30/17 266 lb 3.2 oz (120.7 kg)     Lab Results  Component Value Date   WBC 9.2 08/03/2016   HGB 12.6 08/03/2016   HCT 39.4 08/03/2016   PLT 242.0 08/03/2016   GLUCOSE 90 01/14/2018   CHOL 168 09/28/2017   TRIG 99.0 09/28/2017   HDL 44.70 09/28/2017   LDLDIRECT 148.0 10/14/2010   LDLCALC 104 (H) 09/28/2017    ALT 10 01/14/2018   AST 11 01/14/2018   NA 141 01/14/2018   K 4.8 01/14/2018   CL 103 01/14/2018   CREATININE 1.09 01/14/2018   BUN 14 01/14/2018   CO2 31 01/14/2018   TSH 1.67 01/14/2018   HGBA1C 6.0 07/12/2014   MICROALBUR 0.8 11/15/2014    Dg Bone Density  Result Date: 10/09/2015 EXAM: DUAL X-RAY ABSORPTIOMETRY (DXA) FOR BONE MINERAL DENSITY IMPRESSION: Referring Physician:  Rosalita Chessman PATIENT: Name: Michaela Martinez, Michaela Martinez Patient ID: [id number] Birth Date: 1945-12-23 Height: 67.5 in. Sex: Female Measured: 10/09/2015 Weight: 255.6 lbs. Indications: Estrogen Deficient, Height Loss (781.91), Postmenopausal, Wellbutrin Fractures: Forearm Treatments: Vitamin D (E933.5) ASSESSMENT: The BMD measured at Femur Neck Right is 1.136 g/cm2 with a T-score of 0.7. This patient is considered NORMAL according to [facility]) criteria. There has been a statistically significant increase in BMD of left hip since prior exam dated 12/01/2012. Lumbar spine was not utilized due to advanced degenerative changes. Site Region Measured Date Measured Age YA BMD Significant CHANGE T-score DualFemur Neck Right 10/09/2015 68.1 0[age]7 1.136 g/cm2 Right Forearm Radius 33% 10/09/2015 68.1 1.6 1.015 g/cm2 World Health Organization [facility]) criteria for post-menopausal, Caucasian Women: Normal       T-score at or above -1 SD Osteopenia   T-score between -1 and -2.5 SD Osteoporosis T-score at or below -2.5 SD RECOMMENDATION: [place] recommends that FDA-approved medical therapies be considered in postmenopausal women and men age 34 or older with a: 1. Hip or vertebral (clinical or morphometric) fracture. 2. T-score of <-2.5 at the spine or hip. 3. Ten-year fracture probability by FRAX of 3% or greater for hip fracture or 20% or greater for major osteoporotic fracture. All treatment decisions require clinical judgment and consideration of individual patient factors, including patient preferences,  co-morbidities, previous drug use, risk factors not captured in the FRAX model (e.g. falls, vitamin D deficiency, increased bone turnover, interval significant decline in bone density) and possible under - or over-estimation of fracture risk by FRAX. All patients should ensure an adequate intake of dietary calcium (1200 mg/d) and vitamin D (800 IU daily) unless contraindicated. FOLLOW-UP: People with diagnosed cases of osteoporosis or at high risk for fracture should have regular bone mineral density tests. For patients eligible for Medicare, routine testing is allowed once every 2 years. The testing frequency can be increased to one year for patients who have rapidly progressing disease, those who are receiving or discontinuing medical therapy to restore bone mass, or have additional risk factors. I have reviewed this report, and agree with the above findings. Mark A. Thornton Papas, M.D. [facility] Radiology Electronically Signed   By: Lavonia Dana M.D.   On: 10/09/2015 15:34     Assessment & Plan:  Plan  I have discontinued Michaela Martinez's traMADol and sertraline. I have also changed her spironolactone. Additionally, I am having her start on escitalopram. Lastly, I am having her maintain her triamcinolone cream, Vitamin D, rosuvastatin, metoprolol succinate, losartan, buPROPion, and fluticasone.  Meds ordered this encounter  Medications  . escitalopram (LEXAPRO) 10 MG tablet  Sig: Take 1 tablet (10 mg total) by mouth at bedtime.    Dispense:  30 tablet    Refill:  2  . spironolactone (ALDACTONE) 50 MG tablet    Sig: Take 1 tablet (50 mg total) by mouth 2 (two) times daily.    Dispense:  180 tablet    Refill:  3  . rosuvastatin (CRESTOR) 10 MG tablet    Sig: TAKE 1 TABLET BY MOUTH ONCE DAILY . APPOINTMENT REQUIRED FOR FUTURE REFILLS    Dispense:  90 tablet    Refill:  01    Please consider 90 day supplies to promote better adherence  . metoprolol succinate (TOPROL-XL) 100 MG 24 hr tablet    Sig: Take  1 tablet (100 mg total) by mouth daily. Take with or immediately following a meal.    Dispense:  90 tablet    Refill:  1  . losartan (COZAAR) 100 MG tablet    Sig: Take 1 tablet (100 mg total) by mouth daily.    Dispense:  90 tablet    Refill:  3  . buPROPion (WELLBUTRIN XL) 300 MG 24 hr tablet    Sig: Take 1 tablet (300 mg total) by mouth daily.    Dispense:  90 tablet    Refill:  3  . fluticasone (FLONASE) 50 MCG/ACT nasal spray    Sig: Place 2 sprays into both nostrils daily.    Dispense:  16 g    Refill:  2    Problem List Items Addressed This Visit      Unprioritized   Depression, major, single episode, mild ([place])    Add lexapro to wellbutrin  rto  1 month      Relevant Medications   escitalopram (LEXAPRO) 10 MG tablet   buPROPion (WELLBUTRIN XL) 300 MG 24 hr tablet   Depression, major, single episode, moderate ([facility]) - Primary   Relevant Medications   escitalopram (LEXAPRO) 10 MG tablet   buPROPion (WELLBUTRIN XL) 300 MG 24 hr tablet   Essential hypertension    Well controlled, no changes to meds. Encouraged heart healthy diet such as the DASH diet and exercise as tolerated.       Relevant Medications   spironolactone (ALDACTONE) 50 MG tablet   rosuvastatin (CRESTOR) 10 MG tablet   metoprolol succinate (TOPROL-XL) 100 MG 24 hr tablet   losartan (COZAAR) 100 MG tablet   Hyperlipidemia    Tolerating statin, encouraged heart healthy diet, avoid trans fats, minimize simple carbs and saturated fats. Increase exercise as tolerated      Relevant Medications   spironolactone (ALDACTONE) 50 MG tablet   rosuvastatin (CRESTOR) 10 MG tablet   metoprolol succinate (TOPROL-XL) 100 MG 24 hr tablet   losartan (COZAAR) 100 MG tablet   Localized swelling of both lower extremities    Elevate legs con't compression  Check dopplers Will need art dop if neg venous Consider vascular referral      Lower extremity edema   Relevant Medications   spironolactone (ALDACTONE) 50 MG  tablet   Other Relevant Orders   US Venous Img Lower Unilateral Left (Completed)    Other Visit Diagnoses    Needs flu shot       Relevant Orders   Flu vaccine HIGH DOSE PF (Fluzone High dose)   Hyperlipidemia LDL goal <100       Relevant Medications   spironolactone (ALDACTONE) 50 MG tablet   rosuvastatin (CRESTOR) 10 MG tablet   metoprolol succinate (TOPROL-XL) 100 MG 24  hr tablet   losartan (COZAAR) 100 MG tablet   Seasonal allergic rhinitis due to pollen       Relevant Medications   fluticasone (FLONASE) 50 MCG/ACT nasal spray      Follow-up: Return in about 4 weeks (around 03/17/2018), or depression.  Ann Held, DO

## 2018-02-17 NOTE — Assessment & Plan Note (Signed)
Elevate legs con't compression  Check dopplers Will need art dop if neg venous Consider vascular referral

## 2018-02-17 NOTE — Assessment & Plan Note (Signed)
Add lexapro to wellbutrin  rto  1 month

## 2018-02-25 ENCOUNTER — Ambulatory Visit (HOSPITAL_COMMUNITY)
Admission: RE | Admit: 2018-02-25 | Discharge: 2018-02-25 | Disposition: A | Discharge status: Home / Self Care | Payer: Medicare Other | Source: Ambulatory Visit | Attending: Family Medicine | Admitting: Family Medicine

## 2018-02-25 DIAGNOSIS — M79661 Pain in right lower leg: Secondary | ICD-10-CM | POA: Diagnosis present

## 2018-02-25 DIAGNOSIS — I779 Disorder of arteries and arterioles, unspecified: Secondary | ICD-10-CM | POA: Insufficient documentation

## 2018-02-25 DIAGNOSIS — M79662 Pain in left lower leg: Secondary | ICD-10-CM | POA: Diagnosis not present

## 2018-02-25 NOTE — Progress Notes (Signed)
VASCULAR LAB PRELIMINARY  ARTERIAL  ABI completed:    RIGHT    LEFT    PRESSURE WAVEFORM  PRESSURE WAVEFORM  BRACHIAL 204 Triphasic BRACHIAL 188 Triphasic  DP 171 Biphasic DP 171 Biphasic  PT 178 Triphasic PT 171 Biphasic  GREAT TOE 98 NA GREAT TOE 107 NA    RIGHT LEFT  ABI / TBI 0.87 / 0.48 0.84 / 0.52    ABIs indicate a mild reduction in arterial flow bilaterally at rest. Right TBI is abnormal. Left TBI is normal  Barbi Kumagai, RVS 02/25/2018, 3:24 PM

## 2018-03-02 ENCOUNTER — Other Ambulatory Visit: Payer: Self-pay

## 2018-03-02 DIAGNOSIS — I739 Peripheral vascular disease, unspecified: Secondary | ICD-10-CM

## 2018-03-04 ENCOUNTER — Encounter: Payer: Self-pay | Admitting: Family Medicine

## 2018-03-24 ENCOUNTER — Other Ambulatory Visit: Payer: Self-pay | Admitting: Family Medicine

## 2018-04-06 ENCOUNTER — Other Ambulatory Visit: Payer: Self-pay | Admitting: Family Medicine

## 2018-07-11 ENCOUNTER — Other Ambulatory Visit: Payer: Self-pay | Admitting: Family Medicine

## 2018-07-11 DIAGNOSIS — I10 Essential (primary) hypertension: Secondary | ICD-10-CM

## 2018-07-11 DIAGNOSIS — R600 Localized edema: Secondary | ICD-10-CM

## 2018-09-29 ENCOUNTER — Encounter: Payer: Self-pay | Admitting: Family Medicine

## 2018-09-29 ENCOUNTER — Ambulatory Visit: Payer: Self-pay

## 2018-09-29 NOTE — Telephone Encounter (Signed)
Out going call to Patient who thinks she may have a Urinary tract infection.  Reports that she has a funny sensation  After she voids.  Onset was about a week ago.Patient has been drinking cranberry juice.    Denies pain.  Denies blood in the urine.  Denies flank Pain.  Reviewed protocol and care advice with Patient.    Transferred to [facility]) to schedule a virtual visit.     Michaela Martinez Female, 73 y.o., 1945/12/05 MRN:  [id number] Phone:  [phone] (H) PCP:  Carollee Herter, Alferd Apa, DO Coverage:  [facility] [place] Message from Sheran Luz sent at 09/29/2018 11:32 AM EDT   Summary: possible UTI    Patient requesting call back from nurse. She has complaints of burning w/urination and suspects that she may have UTI. Please advise.         Call History    Type Contact Phone  09/29/2018 11:31 AM Phone (Incoming) [name], [name] (Self) [phone] (H)  User: Sheran Luz    Answer Assessment - Initial Assessment Questions 1. SYMPTOM: "What's the main symptom you're concerned about?" (e.g., frequency, incontinence)      Funny sensation,  2. ONSET: "When did the  *No Answer*  start?"     A week ago 3. PAIN: "Is there any pain?" If so, ask: "How bad is it?" (Scale: 1-10; mild, moderate, severe)    denies 4. CAUSE: "What do you think is causing the symptoms?"     *No Answer* 5. OTHER SYMPTOMS: "Do you have any other symptoms?" (e.g., fever, flank pain, blood in urine, pain with urination)     denies 6. PREGNANCY: "Is there any chance you are pregnant?" "When was your last menstrual period?"  Protocols used: URINARY [facility]

## 2018-09-29 NOTE — Progress Notes (Signed)
Can she do a web visit with me -- after talking to her I may be able to do abx

## 2018-09-29 NOTE — Progress Notes (Signed)
Patient states she has sensation when she urinates. No burning or pain. Wants to know if she can have ABO or come in office to give urine sample.

## 2018-09-30 ENCOUNTER — Other Ambulatory Visit: Payer: Self-pay

## 2018-09-30 ENCOUNTER — Ambulatory Visit (INDEPENDENT_AMBULATORY_CARE_PROVIDER_SITE_OTHER): Payer: Medicare Other | Admitting: Family Medicine

## 2018-09-30 ENCOUNTER — Encounter: Payer: Self-pay | Admitting: Family Medicine

## 2018-09-30 ENCOUNTER — Ambulatory Visit: Payer: Medicare Other | Admitting: Family Medicine

## 2018-09-30 DIAGNOSIS — R300 Dysuria: Secondary | ICD-10-CM

## 2018-09-30 MED ORDER — CEPHALEXIN 500 MG PO CAPS: 500.0000 mg | ORAL_CAPSULE | Freq: Two times a day (BID) | ORAL | 0 refills | Status: DC

## 2018-09-30 NOTE — Progress Notes (Signed)
Virtual Visit via Video Note  I connected with Michaela Martinez on 09/30/18 at  1:30 PM EDT by a video enabled telemedicine application and verified that I am speaking with the correct person using two identifiers.   I discussed the limitations of evaluation and management by telemedicine and the availability of in person appointments. The patient expressed understanding and agreed to proceed.  History of Present Illness: Pt is home and c/o dysuria and and urinary frequency x few days.  She has been drinking cranberry juice and water so she feels a little better today.  No fever  No back pain or abd pain.     Provider is at the office  Past Medical History:  Diagnosis Date  . Depression   . Hypertension   . Menopausal syndrome    Outpatient Encounter Medications as of 09/30/2018  Medication Sig  . buPROPion (WELLBUTRIN XL) 150 MG 24 hr tablet TAKE 3 TABLETS BY MOUTH ONCE DAILY  . buPROPion (WELLBUTRIN XL) 300 MG 24 hr tablet Take 1 tablet (300 mg total) by mouth daily.  . cephALEXin (KEFLEX) 500 MG capsule Take 1 capsule (500 mg total) by mouth 2 (two) times daily.  . Cholecalciferol (VITAMIN D) 2000 units CAPS 1 po qd  . escitalopram (LEXAPRO) 10 MG tablet Take 1 tablet (10 mg total) by mouth at bedtime.  . fluticasone (FLONASE) 50 MCG/ACT nasal spray Place 2 sprays into both nostrils daily.  Marland Kitchen losartan (COZAAR) 100 MG tablet Take 1 tablet (100 mg total) by mouth daily.  . metoprolol succinate (TOPROL-XL) 100 MG 24 hr tablet Take 1 tablet (100 mg total) by mouth daily. Take with or immediately following a meal.  . rosuvastatin (CRESTOR) 10 MG tablet TAKE 1 TABLET BY MOUTH ONCE DAILY . APPOINTMENT REQUIRED FOR FUTURE REFILLS  . spironolactone (ALDACTONE) 50 MG tablet TAKE 1 TABLET BY MOUTH ONCE DAILY  . triamcinolone cream (KENALOG) 0.5 % Apply 1 application topically 3 (three) times daily.   No facility-administered encounter medications on file as of 09/30/2018.     Observations/Objective: Afebrile  No vs obtained due to video visit  Pt is in NAD Pt sitting comfortably at home   Assessment and Plan: 1. Dysuria If no better by Monday we will bring the pt in to get UA and culture  - cephALEXin (KEFLEX) 500 MG capsule; Take 1 capsule (500 mg total) by mouth 2 (two) times daily.  Dispense: 14 capsule; Refill: 0   Follow Up Instructions:    I discussed the assessment and treatment plan with the patient. The patient was provided an opportunity to ask questions and all were answered. The patient agreed with the plan and demonstrated an understanding of the instructions.   The patient was advised to call back or seek an in-person evaluation if the symptoms worsen or if the condition fails to improve as anticipated.  I provided 10 minutes of non-face-to-face time during this encounter.   Ann Held, DO

## 2018-11-10 ENCOUNTER — Other Ambulatory Visit: Payer: Self-pay | Admitting: *Deleted

## 2018-11-10 DIAGNOSIS — I10 Essential (primary) hypertension: Secondary | ICD-10-CM

## 2018-11-10 DIAGNOSIS — E785 Hyperlipidemia, unspecified: Secondary | ICD-10-CM

## 2018-11-10 DIAGNOSIS — R600 Localized edema: Secondary | ICD-10-CM

## 2018-11-10 DIAGNOSIS — F321 Major depressive disorder, single episode, moderate: Secondary | ICD-10-CM

## 2018-11-10 MED ORDER — ROSUVASTATIN CALCIUM 10 MG PO TABS: ORAL_TABLET | ORAL | Status: DC

## 2018-11-10 MED ORDER — SPIRONOLACTONE 50 MG PO TABS: 50.0000 mg | ORAL_TABLET | Freq: Every day | ORAL | 0 refills | Status: DC

## 2018-11-10 MED ORDER — BUPROPION HCL ER (XL) 300 MG PO TB24: 300.0000 mg | ORAL_TABLET | Freq: Every day | ORAL | 0 refills | Status: DC

## 2018-11-10 MED ORDER — METOPROLOL SUCCINATE ER 100 MG PO TB24: 100.0000 mg | ORAL_TABLET | Freq: Every day | ORAL | 0 refills | Status: DC

## 2018-11-28 ENCOUNTER — Other Ambulatory Visit: Payer: Self-pay | Admitting: Family Medicine

## 2018-11-28 DIAGNOSIS — I10 Essential (primary) hypertension: Secondary | ICD-10-CM

## 2018-11-28 DIAGNOSIS — R600 Localized edema: Secondary | ICD-10-CM

## 2018-11-28 DIAGNOSIS — F321 Major depressive disorder, single episode, moderate: Secondary | ICD-10-CM

## 2018-11-28 MED ORDER — BUPROPION HCL ER (XL) 300 MG PO TB24: 300.0000 mg | ORAL_TABLET | Freq: Every day | ORAL | 0 refills | Status: DC

## 2018-11-28 MED ORDER — SPIRONOLACTONE 50 MG PO TABS: 50.0000 mg | ORAL_TABLET | Freq: Every day | ORAL | 0 refills | Status: DC

## 2018-11-28 MED ORDER — METOPROLOL SUCCINATE ER 100 MG PO TB24: 100.0000 mg | ORAL_TABLET | Freq: Every day | ORAL | 0 refills | Status: DC

## 2018-11-28 NOTE — Telephone Encounter (Signed)
buPROPion (WELLBUTRIN XL) 300 MG 24 hr tablet metoprolol succinate (TOPROL-XL) 100 MG 24 hr tablet spironolactone (ALDACTONE) 50 MG tablet  [facility] - [place], [place] - [address]  Please resend these scripts. Pt states that optum is not taking her care card so she can not afford to pay cash. Resend to [place], [place], [place] [employer] [phone] (Phone) [phone] (Fax)

## 2018-11-28 NOTE — Telephone Encounter (Signed)
Refills have been sent.  

## 2018-12-04 ENCOUNTER — Other Ambulatory Visit: Payer: Self-pay | Admitting: Family Medicine

## 2018-12-04 DIAGNOSIS — E785 Hyperlipidemia, unspecified: Secondary | ICD-10-CM

## 2019-02-08 ENCOUNTER — Other Ambulatory Visit: Payer: Self-pay | Admitting: Family Medicine

## 2019-02-08 DIAGNOSIS — I10 Essential (primary) hypertension: Secondary | ICD-10-CM

## 2019-02-08 DIAGNOSIS — F321 Major depressive disorder, single episode, moderate: Secondary | ICD-10-CM

## 2019-02-10 ENCOUNTER — Other Ambulatory Visit: Payer: Self-pay | Admitting: *Deleted

## 2019-02-10 DIAGNOSIS — R600 Localized edema: Secondary | ICD-10-CM

## 2019-02-10 DIAGNOSIS — I10 Essential (primary) hypertension: Secondary | ICD-10-CM

## 2019-02-10 MED ORDER — SPIRONOLACTONE 50 MG PO TABS: 50.0000 mg | ORAL_TABLET | Freq: Every day | ORAL | 0 refills | Status: DC

## 2019-03-02 ENCOUNTER — Other Ambulatory Visit: Payer: Self-pay | Admitting: Family Medicine

## 2019-03-02 DIAGNOSIS — R600 Localized edema: Secondary | ICD-10-CM

## 2019-03-02 DIAGNOSIS — I10 Essential (primary) hypertension: Secondary | ICD-10-CM

## 2019-03-21 ENCOUNTER — Telehealth: Payer: Self-pay

## 2019-03-21 NOTE — Telephone Encounter (Signed)
Copied from [place] [phone]. Topic: General - Other >> Mar 20, 2019 12:18 PM Michaela Martinez wrote: Reason for CRM: Patient called to ask Dr Etter Sjogren if she can place orders for her to get blood work since she have not had any in [place] long while. Patient say everything else is fine please call Ph# [phone]

## 2019-03-21 NOTE — Telephone Encounter (Signed)
appt scheduled

## 2019-03-30 ENCOUNTER — Encounter: Payer: Self-pay | Admitting: Family Medicine

## 2019-03-30 ENCOUNTER — Ambulatory Visit: Payer: Medicare Other | Admitting: Family Medicine

## 2019-03-30 ENCOUNTER — Other Ambulatory Visit: Payer: Self-pay

## 2019-03-30 VITALS — BP 160/80 | HR 81 | Temp 97.5°F | Resp 18 | Ht 67.0 in | Wt 259.0 lb

## 2019-03-30 DIAGNOSIS — E559 Vitamin D deficiency, unspecified: Secondary | ICD-10-CM | POA: Diagnosis not present

## 2019-03-30 DIAGNOSIS — I10 Essential (primary) hypertension: Secondary | ICD-10-CM | POA: Diagnosis not present

## 2019-03-30 DIAGNOSIS — F320 Major depressive disorder, single episode, mild: Secondary | ICD-10-CM | POA: Diagnosis not present

## 2019-03-30 DIAGNOSIS — Z23 Encounter for immunization: Secondary | ICD-10-CM

## 2019-03-30 DIAGNOSIS — E785 Hyperlipidemia, unspecified: Secondary | ICD-10-CM | POA: Diagnosis not present

## 2019-03-30 DIAGNOSIS — I82B19 Acute embolism and thrombosis of unspecified subclavian vein: Secondary | ICD-10-CM

## 2019-03-30 DIAGNOSIS — J301 Allergic rhinitis due to pollen: Secondary | ICD-10-CM

## 2019-03-30 DIAGNOSIS — R600 Localized edema: Secondary | ICD-10-CM

## 2019-03-30 LAB — VITAMIN D 25 HYDROXY (VIT D DEFICIENCY, FRACTURES): VITD: 16.77 ng/mL — ABNORMAL LOW (ref 30.00–100.00)

## 2019-03-30 LAB — COMPREHENSIVE METABOLIC PANEL
ALT: 12 U/L (ref 0–35)
AST: 13 U/L (ref 0–37)
Albumin: 4.2 g/dL (ref 3.5–5.2)
Alkaline Phosphatase: 73 U/L (ref 39–117)
BUN: 10 mg/dL (ref 6–23)
CO2: 33 mEq/L — ABNORMAL HIGH (ref 19–32)
Calcium: 9.4 mg/dL (ref 8.4–10.5)
Chloride: 103 mEq/L (ref 96–112)
Creatinine, Ser: 0.89 mg/dL (ref 0.40–1.20)
GFR: 75.53 mL/min (ref >60.00)
Glucose, Bld: 90 mg/dL (ref 70–99)
Potassium: 4.5 mEq/L (ref 3.5–5.1)
Sodium: 141 mEq/L (ref 135–145)
Total Bilirubin: 0.5 mg/dL (ref 0.2–1.2)
Total Protein: 7.1 g/dL (ref 6.0–8.3)

## 2019-03-30 LAB — LIPID PANEL
Cholesterol: 192 mg/dL (ref 0–200)
HDL: 39.8 mg/dL (ref >39.00)
LDL Cholesterol: 133 mg/dL — ABNORMAL HIGH (ref 0–99)
NonHDL: 152.16
Total CHOL/HDL Ratio: 5
Triglycerides: 97 mg/dL (ref 0.0–149.0)
VLDL: 19.4 mg/dL (ref 0.0–40.0)

## 2019-03-30 LAB — TSH: TSH: 1.23 u[IU]/mL (ref 0.35–4.50)

## 2019-03-30 MED ORDER — FLUTICASONE PROPIONATE 50 MCG/ACT NA SUSP: 2.0000 | Freq: Every day | NASAL | 2 refills | Status: DC

## 2019-03-30 MED ORDER — LOSARTAN POTASSIUM 50 MG PO TABS: 50.0000 mg | ORAL_TABLET | Freq: Every day | ORAL | 3 refills | Status: DC

## 2019-03-30 MED ORDER — SPIRONOLACTONE 50 MG PO TABS: 50.0000 mg | ORAL_TABLET | Freq: Every day | ORAL | 3 refills | Status: DC

## 2019-03-30 MED ORDER — ROSUVASTATIN CALCIUM 10 MG PO TABS: ORAL_TABLET | ORAL | 0 refills | Status: DC

## 2019-03-30 NOTE — Assessment & Plan Note (Signed)
Poorly controlled will alter medications, encouraged DASH diet, minimize caffeine and obtain adequate sleep. Report concerning symptoms and follow up as directed and as needed 

## 2019-03-30 NOTE — Assessment & Plan Note (Signed)
Stable con't meds 

## 2019-03-30 NOTE — Assessment & Plan Note (Signed)
Encouraged heart healthy diet, increase exercise, avoid trans fats, consider a krill oil cap daily Restart statin 

## 2019-03-30 NOTE — Progress Notes (Signed)
.virtual Patient ID: Michaela Martinez, female    DOB: [birth year]  Age: 73 y.o. MRN: [id number]    Subjective:  Subjective  HPI Michaela Martinez presents for f/u chol , bp and depression..   Pt stopped the crestor and losartan because she thought it was making her dizzy    No cp, no sob No other new complaints----  Pt is no longer dizzy.   Review of Systems  Constitutional: Negative for appetite change, diaphoresis, fatigue and unexpected weight change.  Eyes: Negative for pain, redness and visual disturbance.  Respiratory: Negative for cough, chest tightness, shortness of breath and wheezing.   Cardiovascular: Negative for chest pain, palpitations and leg swelling.  Endocrine: Negative for cold intolerance, heat intolerance, polydipsia, polyphagia and polyuria.  Genitourinary: Negative for difficulty urinating, dysuria and frequency.  Neurological: Negative for dizziness, light-headedness, numbness and headaches.    History Past Medical History:  Diagnosis Date  . Depression   . Hypertension   . Menopausal syndrome     She has a past surgical history that includes Inguinal hernia repair (2000) and Tubal ligation (1970).   Her family history includes Alcohol abuse in her unknown relative; Asthma in her son; Breast cancer in her maternal grandmother; Cirrhosis in her brother; Depression in her unknown relative; Diabetes in her unknown relative; Hypertension in her unknown relative; Liver disease in her unknown relative; Pancreatic cancer in her mother and unknown relative.She reports that she has never smoked. She has never used smokeless tobacco. She reports that she does not drink alcohol or use drugs.  Current Outpatient Medications on File Prior to Visit  Medication Sig Dispense Refill  . buPROPion (WELLBUTRIN XL) 300 MG 24 hr tablet Take 1 tablet by mouth once daily 90 tablet 1  . Cholecalciferol (VITAMIN D) 2000 units CAPS 1 po qd 30 capsule   . escitalopram (LEXAPRO) 10 MG tablet Take 1  tablet (10 mg total) by mouth at bedtime. 30 tablet 2  . metoprolol succinate (TOPROL-XL) 100 MG 24 hr tablet TAKE 1 TABLET BY MOUTH ONCE DAILY .  TAKE  WITH  OR  IMMEDIATELY  FOLLOWING  A  MEAL 90 tablet 1  . triamcinolone cream (KENALOG) 0.5 % Apply 1 application topically 3 (three) times daily. 30 g 1   No current facility-administered medications on file prior to visit.      Objective:  Objective  Physical Exam Vitals signs and nursing note reviewed.  Constitutional:      Appearance: She is well-developed.  HENT:     Head: Normocephalic and atraumatic.  Eyes:     Conjunctiva/sclera: Conjunctivae normal.  Neck:     Musculoskeletal: Normal range of motion and neck supple.     Thyroid: No thyromegaly.     Vascular: No carotid bruit or JVD.  Cardiovascular:     Rate and Rhythm: Normal rate and regular rhythm.     Heart sounds: Normal heart sounds. No murmur.  Pulmonary:     Effort: Pulmonary effort is normal. No respiratory distress.     Breath sounds: Normal breath sounds. No wheezing or rales.  Chest:     Chest wall: No tenderness.  Neurological:     Mental Status: She is alert and oriented to person, place, and time.    BP (!) 160/80 (BP Location: Right Arm, Patient Position: Sitting, Cuff Size: Large)   Pulse 81   Temp (!) 97.5 F (36.4 C) (Temporal)   Resp 18   Ht 5\' 7"  (1.702 m)  Wt 259 lb (117.5 kg)   SpO2 98%   BMI 40.57 kg/m  Wt Readings from Last 3 Encounters:  03/30/19 259 lb (117.5 kg)  02/17/18 265 lb 9.6 oz (120.5 kg)  01/14/18 264 lb (119.7 kg)     Lab Results  Component Value Date   WBC 9.2 08/03/2016   HGB 12.6 08/03/2016   HCT 39.4 08/03/2016   PLT 242.0 08/03/2016   GLUCOSE 90 01/14/2018   CHOL 168 09/28/2017   TRIG 99.0 09/28/2017   HDL 44.70 09/28/2017   LDLDIRECT 148.0 10/14/2010   LDLCALC 104 (H) 09/28/2017   ALT 10 01/14/2018   AST 11 01/14/2018   NA 141 01/14/2018   K 4.8 01/14/2018   CL 103 01/14/2018   CREATININE 1.09  01/14/2018   BUN 14 01/14/2018   CO2 31 01/14/2018   TSH 1.67 01/14/2018   HGBA1C 6.0 07/12/2014   MICROALBUR 0.8 11/15/2014    Vas [place] With/wo Tbi  Result Date: 02/27/2018 LOWER EXTREMITY DOPPLER STUDY Indications: Claudication. High Risk Factors: Hypertension, hyperlipidemia.  Performing Technologist: Birdena Crandall, [place] [facility]  Examination Guidelines: A complete evaluation includes at minimum, Doppler waveform signals and systolic blood pressure reading at the level of bilateral brachial, anterior tibial, and posterior tibial arteries, when vessel segments are accessible. Bilateral testing is considered an integral part of a complete examination. Photoelectric Plethysmograph (PPG) waveforms and toe systolic pressure readings are included as required and additional duplex testing as needed. Limited examinations for reoccurring indications may be performed as noted.  ABI Findings: +---------+------------------+-----+---------+--------+ Right    Rt Pressure (mmHg)IndexWaveform Comment  +---------+------------------+-----+---------+--------+ Brachial 204                    triphasic         +---------+------------------+-----+---------+--------+ PTA      178               0.87 triphasic         +---------+------------------+-----+---------+--------+ DP       171               0.84 biphasic          +---------+------------------+-----+---------+--------+ Great Toe98                0.48                   +---------+------------------+-----+---------+--------+ +---------+------------------+-----+--------+-------+ Left     Lt Pressure (mmHg)IndexWaveformComment +---------+------------------+-----+--------+-------+ Brachial 188                                    +---------+------------------+-----+--------+-------+ PTA      171               0.84                 +---------+------------------+-----+--------+-------+ DP       171               0.84                  +---------+------------------+-----+--------+-------+ Great Toe107               0.52                 +---------+------------------+-----+--------+-------+ +-------+-----------+-----------+------------+------------+ ABI/TBIToday's ABIToday's TBIPrevious ABIPrevious TBI +-------+-----------+-----------+------------+------------+ Right  0.87       0.48                                +-------+-----------+-----------+------------+------------+  Left   0.84       0.52                                +-------+-----------+-----------+------------+------------+  Final Interpretation: Right: Resting right ankle-brachial index indicates mild right lower extremity arterial disease. The right toe-brachial index is abnormal. Left: Resting left ankle-brachial index indicates mild left lower extremity arterial disease. The left toe-brachial index is normal.  *See table(s) above for measurements and observations.  Electronically signed by Harold Barban MD on 02/27/2018 at 5:56:29 PM.   Final      Assessment & Plan:  Plan  I have discontinued Zaria Sharpley's losartan and cephALEXin. I have also changed her spironolactone. Additionally, I am having her start on losartan. Lastly, I am having her maintain her triamcinolone cream, Vitamin D, escitalopram, buPROPion, metoprolol succinate, rosuvastatin, and fluticasone.  Meds ordered this encounter  Medications  . losartan (COZAAR) 50 MG tablet    Sig: Take 1 tablet (50 mg total) by mouth daily.    Dispense:  90 tablet    Refill:  3  . rosuvastatin (CRESTOR) 10 MG tablet    Sig: TAKE 1 TABLET BY MOUTH ONCE DAILY . APPOINTMENT REQUIRED FOR FUTURE REFILLS    Dispense:  90 tablet    Refill:  0  . fluticasone (FLONASE) 50 MCG/ACT nasal spray    Sig: Place 2 sprays into both nostrils daily.    Dispense:  16 g    Refill:  2  . spironolactone (ALDACTONE) 50 MG tablet    Sig: Take 1 tablet (50 mg total) by mouth daily.    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 Report concerning symptoms and follow up as directed and as needed      Relevant Medications   losartan (COZAAR) 50 MG tablet   rosuvastatin (CRESTOR) 10 MG tablet   spironolactone (ALDACTONE) 50 MG tablet   Other Relevant Orders   Lipid panel   Comprehensive metabolic panel   TSH   Hyperlipidemia    Encouraged heart healthy diet, increase exercise, avoid trans fats, consider a krill oil cap daily Restart statin      Relevant Medications   losartan (COZAAR) 50 MG tablet   rosuvastatin (CRESTOR) 10 MG tablet   spironolactone (ALDACTONE) 50 MG tablet   Other Relevant Orders   Lipid panel   Comprehensive metabolic panel    Other Visit Diagnoses    Hyperlipidemia LDL goal <100       Relevant Medications   losartan (COZAAR) 50 MG tablet   rosuvastatin (CRESTOR) 10 MG tablet   spironolactone (ALDACTONE) 50 MG tablet   Seasonal allergic rhinitis due to pollen       Relevant Medications   fluticasone (FLONASE) 50 MCG/ACT nasal spray   Vitamin D deficiency  Relevant Orders   Vitamin D (25 hydroxy)   Edema, lower extremity       Relevant Medications   spironolactone (ALDACTONE) 50 MG tablet   Need for influenza vaccination       Relevant Orders   Flu Vaccine QUAD High Dose(Fluad) (Completed)      Follow-up: Return in about 3 weeks (around 04/20/2019), or if symptoms worsen or fail to improve, for hypertension.  Ann Held, DO     +

## 2019-04-10 ENCOUNTER — Other Ambulatory Visit: Payer: Self-pay | Admitting: *Deleted

## 2019-04-10 DIAGNOSIS — E559 Vitamin D deficiency, unspecified: Secondary | ICD-10-CM

## 2019-04-10 DIAGNOSIS — E785 Hyperlipidemia, unspecified: Secondary | ICD-10-CM

## 2019-04-10 DIAGNOSIS — I10 Essential (primary) hypertension: Secondary | ICD-10-CM

## 2019-04-10 MED ORDER — ROSUVASTATIN CALCIUM 20 MG PO TABS: 20.0000 mg | ORAL_TABLET | Freq: Every day | ORAL | 0 refills | Status: DC

## 2019-04-10 MED ORDER — VITAMIN D (ERGOCALCIFEROL) 1.25 MG (50000 UNIT) PO CAPS: 50000.0000 [IU] | ORAL_CAPSULE | ORAL | 0 refills | Status: DC

## 2019-04-20 ENCOUNTER — Ambulatory Visit: Payer: Medicare Other | Admitting: Family Medicine

## 2019-07-04 ENCOUNTER — Telehealth: Payer: Self-pay | Admitting: Family Medicine

## 2019-07-04 NOTE — Telephone Encounter (Signed)
Left message for patient to call office to schedule AWV with V[facility].   Last AWV 07/2016. SF

## 2019-07-11 ENCOUNTER — Other Ambulatory Visit: Payer: Medicare Other

## 2019-09-08 ENCOUNTER — Other Ambulatory Visit: Payer: Self-pay | Admitting: Family Medicine

## 2019-09-08 DIAGNOSIS — R600 Localized edema: Secondary | ICD-10-CM

## 2019-09-08 DIAGNOSIS — I10 Essential (primary) hypertension: Secondary | ICD-10-CM

## 2019-09-28 ENCOUNTER — Other Ambulatory Visit: Payer: Self-pay | Admitting: Family Medicine

## 2019-09-28 ENCOUNTER — Other Ambulatory Visit: Payer: Self-pay

## 2019-09-28 DIAGNOSIS — I10 Essential (primary) hypertension: Secondary | ICD-10-CM

## 2019-09-28 DIAGNOSIS — R600 Localized edema: Secondary | ICD-10-CM

## 2019-09-28 MED ORDER — METOPROLOL SUCCINATE ER 100 MG PO TB24: 100.0000 mg | ORAL_TABLET | Freq: Every day | ORAL | 0 refills | Status: DC

## 2019-09-28 MED ORDER — ROSUVASTATIN CALCIUM 20 MG PO TABS: 20.0000 mg | ORAL_TABLET | Freq: Every day | ORAL | 0 refills | Status: DC

## 2019-09-28 MED ORDER — VITAMIN D (ERGOCALCIFEROL) 1.25 MG (50000 UNIT) PO CAPS: 50000.0000 [IU] | ORAL_CAPSULE | ORAL | 0 refills | Status: DC

## 2019-09-28 MED ORDER — SPIRONOLACTONE 50 MG PO TABS: 50.0000 mg | ORAL_TABLET | Freq: Every day | ORAL | 0 refills | Status: DC

## 2019-09-29 ENCOUNTER — Encounter: Payer: Self-pay | Admitting: Family Medicine

## 2019-09-29 ENCOUNTER — Ambulatory Visit (INDEPENDENT_AMBULATORY_CARE_PROVIDER_SITE_OTHER): Payer: Medicare PPO | Admitting: Family Medicine

## 2019-09-29 VITALS — BP 160/90 | HR 78 | Temp 97.0°F | Resp 18 | Ht 67.0 in | Wt 263.8 lb

## 2019-09-29 DIAGNOSIS — Z1211 Encounter for screening for malignant neoplasm of colon: Secondary | ICD-10-CM | POA: Diagnosis not present

## 2019-09-29 DIAGNOSIS — E559 Vitamin D deficiency, unspecified: Secondary | ICD-10-CM

## 2019-09-29 DIAGNOSIS — Z0001 Encounter for general adult medical examination with abnormal findings: Secondary | ICD-10-CM

## 2019-09-29 DIAGNOSIS — I10 Essential (primary) hypertension: Secondary | ICD-10-CM

## 2019-09-29 DIAGNOSIS — E785 Hyperlipidemia, unspecified: Secondary | ICD-10-CM

## 2019-09-29 DIAGNOSIS — J301 Allergic rhinitis due to pollen: Secondary | ICD-10-CM | POA: Diagnosis not present

## 2019-09-29 DIAGNOSIS — E2839 Other primary ovarian failure: Secondary | ICD-10-CM

## 2019-09-29 DIAGNOSIS — R600 Localized edema: Secondary | ICD-10-CM | POA: Diagnosis not present

## 2019-09-29 DIAGNOSIS — Z1231 Encounter for screening mammogram for malignant neoplasm of breast: Secondary | ICD-10-CM

## 2019-09-29 DIAGNOSIS — F329 Major depressive disorder, single episode, unspecified: Secondary | ICD-10-CM

## 2019-09-29 DIAGNOSIS — Z0000 Encounter for general adult medical examination without abnormal findings: Secondary | ICD-10-CM

## 2019-09-29 DIAGNOSIS — F419 Anxiety disorder, unspecified: Secondary | ICD-10-CM | POA: Diagnosis not present

## 2019-09-29 LAB — CBC WITH DIFFERENTIAL/PLATELET
Basophils Absolute: 0 10*3/uL (ref 0.0–0.1)
Basophils Relative: 0.3 % (ref 0.0–3.0)
Eosinophils Absolute: 0.1 10*3/uL (ref 0.0–0.7)
Eosinophils Relative: 0.9 % (ref 0.0–5.0)
HCT: 40.2 % (ref 36.0–46.0)
Hemoglobin: 12.8 g/dL (ref 12.0–15.0)
Lymphocytes Relative: 23.4 % (ref 12.0–46.0)
Lymphs Abs: 1.8 10*3/uL (ref 0.7–4.0)
MCHC: 31.8 g/dL (ref 30.0–36.0)
MCV: 85.4 fl (ref 78.0–100.0)
Monocytes Absolute: 0.5 10*3/uL (ref 0.1–1.0)
Monocytes Relative: 6.1 % (ref 3.0–12.0)
Neutro Abs: 5.4 10*3/uL (ref 1.4–7.7)
Neutrophils Relative %: 69.3 % (ref 43.0–77.0)
Platelets: 228 10*3/uL (ref 150.0–400.0)
RBC: 4.71 Mil/uL (ref 3.87–5.11)
RDW: 14.7 % (ref 11.5–15.5)
WBC: 7.9 10*3/uL (ref 4.0–10.5)

## 2019-09-29 LAB — COMPREHENSIVE METABOLIC PANEL
ALT: 12 U/L (ref 0–35)
AST: 13 U/L (ref 0–37)
Albumin: 4.1 g/dL (ref 3.5–5.2)
Alkaline Phosphatase: 79 U/L (ref 39–117)
BUN: 13 mg/dL (ref 6–23)
CO2: 33 mEq/L — ABNORMAL HIGH (ref 19–32)
Calcium: 9.2 mg/dL (ref 8.4–10.5)
Chloride: 101 mEq/L (ref 96–112)
Creatinine, Ser: 0.87 mg/dL (ref 0.40–1.20)
GFR: 77.42 mL/min (ref >60.00)
Glucose, Bld: 86 mg/dL (ref 70–99)
Potassium: 4.2 mEq/L (ref 3.5–5.1)
Sodium: 140 mEq/L (ref 135–145)
Total Bilirubin: 0.4 mg/dL (ref 0.2–1.2)
Total Protein: 7.1 g/dL (ref 6.0–8.3)

## 2019-09-29 LAB — VITAMIN D 25 HYDROXY (VIT D DEFICIENCY, FRACTURES): VITD: 24.04 ng/mL — ABNORMAL LOW (ref 30.00–100.00)

## 2019-09-29 LAB — LIPID PANEL
Cholesterol: 120 mg/dL (ref 0–200)
HDL: 44.5 mg/dL (ref >39.00)
LDL Cholesterol: 59 mg/dL (ref 0–99)
NonHDL: 75.66
Total CHOL/HDL Ratio: 3
Triglycerides: 82 mg/dL (ref 0.0–149.0)
VLDL: 16.4 mg/dL (ref 0.0–40.0)

## 2019-09-29 MED ORDER — LOSARTAN POTASSIUM 50 MG PO TABS: 50.0000 mg | ORAL_TABLET | Freq: Every day | ORAL | 3 refills | Status: DC

## 2019-09-29 MED ORDER — VITAMIN D (ERGOCALCIFEROL) 1.25 MG (50000 UNIT) PO CAPS: 50000.0000 [IU] | ORAL_CAPSULE | ORAL | 1 refills | Status: DC

## 2019-09-29 MED ORDER — BUPROPION HCL ER (XL) 300 MG PO TB24: 300.0000 mg | ORAL_TABLET | Freq: Every day | ORAL | 3 refills | Status: DC

## 2019-09-29 MED ORDER — SPIRONOLACTONE 50 MG PO TABS: 50.0000 mg | ORAL_TABLET | Freq: Every day | ORAL | 3 refills | Status: AC

## 2019-09-29 MED ORDER — ROSUVASTATIN CALCIUM 20 MG PO TABS: 20.0000 mg | ORAL_TABLET | Freq: Every day | ORAL | 3 refills | Status: DC

## 2019-09-29 MED ORDER — FLUTICASONE PROPIONATE 50 MCG/ACT NA SUSP: 2.0000 | Freq: Every day | NASAL | 2 refills | Status: AC

## 2019-09-29 MED ORDER — ESCITALOPRAM OXALATE 10 MG PO TABS: 10.0000 mg | ORAL_TABLET | Freq: Every day | ORAL | 3 refills | Status: DC

## 2019-09-29 MED ORDER — ESCITALOPRAM OXALATE 20 MG PO TABS: 20.0000 mg | ORAL_TABLET | Freq: Every day | ORAL | 3 refills | Status: DC

## 2019-09-29 MED ORDER — METOPROLOL SUCCINATE ER 100 MG PO TB24: 100.0000 mg | ORAL_TABLET | Freq: Every day | ORAL | 3 refills | Status: DC

## 2019-09-29 MED ORDER — LOSARTAN POTASSIUM 100 MG PO TABS: 100.0000 mg | ORAL_TABLET | Freq: Every day | ORAL | 3 refills | Status: DC

## 2019-09-29 NOTE — Patient Instructions (Signed)
Preventive Care 72 Years and Older, Female Preventive care refers to lifestyle choices and visits with your health care provider that can promote health and wellness. This includes:  A yearly physical exam. This is also called an annual well check.  Regular dental and eye exams.  Immunizations.  Screening for certain conditions.  Healthy lifestyle choices, such as diet and exercise. What can I expect for my preventive care visit? Physical exam Your health care provider will check:  Height and weight. These may be used to calculate body mass index (BMI), which is a measurement that tells if you are at a healthy weight.  Heart rate and blood pressure.  Your skin for abnormal spots. Counseling Your health care provider may ask you questions about:  Alcohol, tobacco, and drug use.  Emotional well-being.  Home and relationship well-being.  Sexual activity.  Eating habits.  History of falls.  Memory and ability to understand (cognition).  Work and work Statistician.  Pregnancy and menstrual history. What immunizations do I need?  Influenza (flu) vaccine  This is recommended every year. Tetanus, diphtheria, and pertussis (Tdap) vaccine  You may need a Td booster every 10 years. Varicella (chickenpox) vaccine  You may need this vaccine if you have not already been vaccinated. Zoster (shingles) vaccine  You may need this after age 72. Pneumococcal conjugate (PCV13) vaccine  One dose is recommended after age 72. Pneumococcal polysaccharide (PPSV23) vaccine  One dose is recommended after age 72. Measles, mumps, and rubella (MMR) vaccine  You may need at least one dose of MMR if you were born in 1957 or later. You may also need a second dose. Meningococcal conjugate (MenACWY) vaccine  You may need this if you have certain conditions. Hepatitis A vaccine  You may need this if you have certain conditions or if you travel or work in places where you may be exposed  to hepatitis A. Hepatitis B vaccine  You may need this if you have certain conditions or if you travel or work in places where you may be exposed to hepatitis B. Haemophilus influenzae type b (Hib) vaccine  You may need this if you have certain conditions. You may receive vaccines as individual doses or as more than one vaccine together in one shot (combination vaccines). Talk with your health care provider about the risks and benefits of combination vaccines. What tests do I need? Blood tests  Lipid and cholesterol levels. These may be checked every 5 years, or more frequently depending on your overall health.  Hepatitis C test.  Hepatitis B test. Screening  Lung cancer screening. You may have this screening every year starting at age 72 if you have a 30-pack-year history of smoking and currently smoke or have quit within the past 15 years.  Colorectal cancer screening. All adults should have this screening starting at age 72 and continuing until age 15. Your health care provider may recommend screening at age 72 if you are at increased risk. You will have tests every 1-10 years, depending on your results and the type of screening test.  Diabetes screening. This is done by checking your blood sugar (glucose) after you have not eaten for a while (fasting). You may have this done every 1-3 years.  Mammogram. This may be done every 1-2 years. Talk with your health care provider about how often you should have regular mammograms.  BRCA-related cancer screening. This may be done if you have a family history of breast, ovarian, tubal, or peritoneal cancers.  Other tests  Sexually transmitted disease (STD) testing.  Bone density scan. This is done to screen for osteoporosis. You may have this done starting at age 72. Follow these instructions at home: Eating and drinking  Eat a diet that includes fresh fruits and vegetables, whole grains, lean protein, and low-fat dairy products. Limit  your intake of foods with high amounts of sugar, saturated fats, and salt.  Take vitamin and mineral supplements as recommended by your health care provider.  Do not drink alcohol if your health care provider tells you not to drink.  If you drink alcohol: ? Limit how much you have to 0-1 drink a day. ? Be aware of how much alcohol is in your drink. In the [place] one drink equals one 12 oz bottle of beer (355 mL), one 5 oz glass of wine (148 mL), or one 1 oz glass of hard liquor (44 mL). Lifestyle  Take daily care of your teeth and gums.  Stay active. Exercise for at least 30 minutes on 5 or more days each week.  Do not use any products that contain nicotine or tobacco, such as cigarettes, e-cigarettes, and chewing tobacco. If you need help quitting, ask your health care provider.  If you are sexually active, practice safe sex. Use a condom or other form of protection in order to prevent STIs (sexually transmitted infections).  Talk with your health care provider about taking a low-dose aspirin or statin. What's next?  Go to your health care provider once a year for a well check visit.  Ask your health care provider how often you should have your eyes and teeth checked.  Stay up to date on all vaccines. This information is not intended to replace advice given to you by your health care provider. Make sure you discuss any questions you have with your health care provider. Document Revised: 05/19/2018 Document Reviewed: 05/19/2018 Elsevier Patient Education  2020 [employer].

## 2019-09-29 NOTE — Progress Notes (Signed)
Subjective:     Michaela Martinez is a 74 y.o. female and is here for a comprehensive physical exam. The patient reports no problems.--- except anxiety is worse  Her husband was dx stage 4 lung cancer and is in tx now.  Social History   Socioeconomic History  . Marital status: Married    Spouse name: Not on file  . Number of children: Not on file  . Years of education: Not on file  . Highest education level: Not on file  Occupational History  . Not on file  Tobacco Use  . Smoking status: Never Smoker  . Smokeless tobacco: Never Used  Substance and Sexual Activity  . Alcohol use: No  . Drug use: No  . Sexual activity: Not Currently    Partners: Male  Other Topics Concern  . Not on file  Social History Narrative  . Not on file   Social Determinants of Health   Financial Resource Strain:   . Difficulty of Paying Living Expenses:   Food Insecurity:   . Worried About Charity fundraiser in the Last Year:   . Arboriculturist in the Last Year:   Transportation Needs:   . Film/video editor (Medical):   Marland Kitchen Lack of Transportation (Non-Medical):   Physical Activity:   . Days of Exercise per Week:   . Minutes of Exercise per Session:   Stress:   . Feeling of Stress :   Social Connections:   . Frequency of Communication with Friends and Family:   . Frequency of Social Gatherings with Friends and Family:   . Attends Religious Services:   . Active Member of Clubs or Organizations:   . Attends Archivist Meetings:   Marland Kitchen Marital Status:   Intimate Partner Violence:   . Fear of Current or Ex-Partner:   . Emotionally Abused:   Marland Kitchen Physically Abused:   . Sexually Abused:    Health Maintenance  Topic Date Due  . MAMMOGRAM  08/29/2017  . COLONOSCOPY  09/28/2020 (Originally 01/04/2013)  . INFLUENZA VACCINE  01/07/2020  . TETANUS/TDAP  10/07/2025  . DEXA SCAN  Completed  . COVID-19 Vaccine  Completed  . Hepatitis C Screening  Completed  . PNA vac Low Risk Adult  Completed     The following portions of the patient's history were reviewed and updated as appropriate:  She  has a past medical history of Depression, Hypertension, and Menopausal syndrome. She does not have any pertinent problems on file. She  has a past surgical history that includes Inguinal hernia repair (2000) and Tubal ligation (1970). Her family history includes Alcohol abuse in an other family member; Asthma in her son; Breast cancer in her maternal grandmother; Cirrhosis in her brother; Depression in an other family member; Diabetes in an other family member; Hypertension in an other family member; Liver disease in an other family member; Pancreatic cancer in her mother and another family member. She  reports that she has never smoked. She has never used smokeless tobacco. She reports that she does not drink alcohol or use drugs. She has a current medication list which includes the following prescription(s): bupropion, vitamin d, fluticasone, metoprolol succinate, rosuvastatin, spironolactone, triamcinolone cream, vitamin d (ergocalciferol), escitalopram, and losartan. Current Outpatient Medications on File Prior to Visit  Medication Sig Dispense Refill  . Cholecalciferol (VITAMIN D) 2000 units CAPS 1 po qd 30 capsule   . triamcinolone cream (KENALOG) 0.5 % Apply 1 application topically 3 (three) times daily.  30 g 1   No current facility-administered medications on file prior to visit.   She is allergic to norvasc [amlodipine besylate] and lipitor [atorvastatin calcium]..  Review of Systems Review of Systems  Constitutional: Negative for activity change, appetite change and fatigue.  HENT: Negative for hearing loss, congestion, tinnitus and ear discharge.  dentist q40m Eyes: Negative for visual disturbance (see optho q1y -- vision corrected to 20/20 with glasses).  Respiratory: Negative for cough, chest tightness and shortness of breath.   Cardiovascular: Negative for chest pain, palpitations  and leg swelling.  Gastrointestinal: Negative for abdominal pain, diarrhea, constipation and abdominal distention.  Genitourinary: Negative for urgency, frequency, decreased urine volume and difficulty urinating.  Musculoskeletal: + arthralgia exp in knee  Skin: Negative for color change, pallor and rash.  Neurological: Negative for dizziness, light-headedness, numbness and headaches.  Hematological: Negative for adenopathy. Does not bruise/bleed easily.  Psychiatric/Behavioral: Negative for suicidal ideas, confusion, sleep disturbance, self-injury, dysphoric mood, decreased concentration and agitation. --she states her anxiety is worsening due to the situation with her husband       Objective:    BP (!) 160/90 (BP Location: Left Arm, Patient Position: Sitting, Cuff Size: Large)   Pulse 78   Temp (!) 97 F (36.1 C) (Temporal)   Resp 18   Ht 5\' 7"  (1.702 m)   Wt 263 lb 12.8 oz (119.7 kg)   SpO2 98%   BMI 41.32 kg/m  General appearance: alert, cooperative, appears stated age and no distress Head: Normocephalic, without obvious abnormality, atraumatic Eyes: negative findings: lids and lashes normal, conjunctivae and sclerae normal and pupils equal, round, reactive to light and accomodation Ears: normal TM's and external ear canals both ears Neck: no adenopathy, no carotid bruit, no JVD, supple, symmetrical, trachea midline and thyroid not enlarged, symmetric, no tenderness/mass/nodules Back: symmetric, no curvature. ROM normal. No CVA tenderness. Lungs: clear to auscultation bilaterally Breasts: normal appearance, no masses or tenderness Heart: regular rate and rhythm, S1, S2 normal, no murmur, click, rub or gallop Abdomen: soft, non-tender; bowel sounds normal; no masses,  no organomegaly Pelvic: not indicated; post-menopausal, no abnormal Pap smears in past Extremities: extremities normal, atraumatic, no cyanosis or edema Pulses: 2+ and symmetric Skin: Skin color, texture, turgor  normal. No rashes or lesions Lymph nodes: Cervical, supraclavicular, and axillary nodes normal. Neurologic: Alert and oriented X 3, normal strength and tone. Normal symmetric reflexes. Normal coordination and gait    Assessment:    Healthy female exam.      Plan:     ghm utd Check labs  See After Visit Summary for Counseling Recommendations    1. Depression with anxiety  Worsening anxiety Inc lexapro 20 mg  - buPROPion (WELLBUTRIN XL) 300 MG 24 hr tablet; Take 1 tablet (300 mg total) by mouth daily.  Dispense: 90 tablet; Refill: 3 - escitalopram (LEXAPRO) 20 MG tablet; Take 1 tablet (20 mg total) by mouth daily.  Dispense: 90 tablet; Refill: 3  2. Essential hypertension Poorly controlled will alter medications, encouraged DASH diet, minimize caffeine and obtain adequate sleep. Report concerning symptoms and follow up as directed and as needed - metoprolol succinate (TOPROL-XL) 100 MG 24 hr tablet; Take 1 tablet (100 mg total) by mouth daily. Take with or immediately following a meal.  Dispense: 90 tablet; Refill: 3 - spironolactone (ALDACTONE) 50 MG tablet; Take 1 tablet (50 mg total) by mouth daily.  Dispense: 90 tablet; Refill: 3 - Lipid panel - Comprehensive metabolic panel - losartan (COZAAR) 100  MG tablet; Take 1 tablet (100 mg total) by mouth daily.  Dispense: 90 tablet; Refill: 3  3. Edema, lower extremity Stable con't compression socks  - spironolactone (ALDACTONE) 50 MG tablet; Take 1 tablet (50 mg total) by mouth daily.  Dispense: 90 tablet; Refill: 3 - CBC with Differential/Platelet - Comprehensive metabolic panel  4. Seasonal allergic rhinitis due to pollen Refill flonase - fluticasone (FLONASE) 50 MCG/ACT nasal spray; Place 2 sprays into both nostrils daily.  Dispense: 16 g; Refill: 2  5. Vitamin D deficiency Check labs  - Vitamin D, Ergocalciferol, (DRISDOL) 1.25 MG (50000 UNIT) CAPS capsule; Take 1 capsule (50,000 Units total) by mouth every 7 (seven) days.   Dispense: 12 capsule; Refill: 1 - Vitamin D (25 hydroxy)  6. Hyperlipidemia, unspecified hyperlipidemia type Tolerating statin, encouraged heart healthy diet, avoid trans fats, minimize simple carbs and saturated fats. Increase exercise as tolerated - rosuvastatin (CRESTOR) 20 MG tablet; Take 1 tablet (20 mg total) by mouth daily.  Dispense: 90 tablet; Refill: 3 - Lipid panel - Comprehensive metabolic panel  7. Estrogen deficiency   - DG Bone Density; Future - Vitamin D (25 hydroxy)  8. Encounter for screening mammogram for malignant neoplasm of breast   - MM DIGITAL SCREENING BILATERAL; Future  9. Colon cancer screening Pt refusing colonoscopy  - Fecal occult blood, imunochemical; Future  10. Preventative health care See above

## 2019-10-01 ENCOUNTER — Other Ambulatory Visit: Payer: Self-pay | Admitting: Family Medicine

## 2019-10-11 ENCOUNTER — Telehealth: Payer: Self-pay

## 2019-10-11 NOTE — Telephone Encounter (Signed)
Patient called in to see if DR. Lowne could put in a referral for a mammogram and a bone density scan at a office in [place]. Per the patient she do not want to go to the office in [place]. Please call the patient once this is done to advise at [phone]

## 2019-10-12 NOTE — Telephone Encounter (Signed)
Okay to place referrals? Please advise

## 2019-10-13 ENCOUNTER — Ambulatory Visit (HOSPITAL_BASED_OUTPATIENT_CLINIC_OR_DEPARTMENT_OTHER): Payer: Medicare PPO

## 2019-10-13 ENCOUNTER — Other Ambulatory Visit (HOSPITAL_BASED_OUTPATIENT_CLINIC_OR_DEPARTMENT_OTHER): Payer: Medicare PPO

## 2020-12-25 ENCOUNTER — Other Ambulatory Visit: Payer: Self-pay | Admitting: Family Medicine

## 2020-12-25 DIAGNOSIS — F32A Depression, unspecified: Secondary | ICD-10-CM

## 2020-12-25 DIAGNOSIS — I10 Essential (primary) hypertension: Secondary | ICD-10-CM

## 2020-12-25 DIAGNOSIS — F419 Anxiety disorder, unspecified: Secondary | ICD-10-CM

## 2021-01-02 ENCOUNTER — Other Ambulatory Visit: Payer: Self-pay

## 2021-01-02 ENCOUNTER — Encounter: Payer: Self-pay | Admitting: Family Medicine

## 2021-01-02 ENCOUNTER — Ambulatory Visit: Payer: Medicare PPO | Admitting: Family Medicine

## 2021-01-02 VITALS — BP 160/100 | HR 79 | Temp 98.4°F | Resp 18 | Ht 67.0 in | Wt 264.8 lb

## 2021-01-02 DIAGNOSIS — F32A Depression, unspecified: Secondary | ICD-10-CM | POA: Diagnosis not present

## 2021-01-02 DIAGNOSIS — I10 Essential (primary) hypertension: Secondary | ICD-10-CM

## 2021-01-02 DIAGNOSIS — F320 Major depressive disorder, single episode, mild: Secondary | ICD-10-CM | POA: Diagnosis not present

## 2021-01-02 DIAGNOSIS — I82B19 Acute embolism and thrombosis of unspecified subclavian vein: Secondary | ICD-10-CM | POA: Diagnosis not present

## 2021-01-02 DIAGNOSIS — E785 Hyperlipidemia, unspecified: Secondary | ICD-10-CM | POA: Diagnosis not present

## 2021-01-02 DIAGNOSIS — F321 Major depressive disorder, single episode, moderate: Secondary | ICD-10-CM

## 2021-01-02 DIAGNOSIS — F419 Anxiety disorder, unspecified: Secondary | ICD-10-CM

## 2021-01-02 LAB — LIPID PANEL
Cholesterol: 170 mg/dL (ref 0–200)
HDL: 38.7 mg/dL — ABNORMAL LOW (ref >39.00)
LDL Cholesterol: 119 mg/dL — ABNORMAL HIGH (ref 0–99)
NonHDL: 131.64
Total CHOL/HDL Ratio: 4
Triglycerides: 65 mg/dL (ref 0.0–149.0)
VLDL: 13 mg/dL (ref 0.0–40.0)

## 2021-01-02 LAB — COMPREHENSIVE METABOLIC PANEL
ALT: 10 U/L (ref 0–35)
AST: 12 U/L (ref 0–37)
Albumin: 4.1 g/dL (ref 3.5–5.2)
Alkaline Phosphatase: 84 U/L (ref 39–117)
BUN: 14 mg/dL (ref 6–23)
CO2: 30 mEq/L (ref 19–32)
Calcium: 9.2 mg/dL (ref 8.4–10.5)
Chloride: 102 mEq/L (ref 96–112)
Creatinine, Ser: 0.89 mg/dL (ref 0.40–1.20)
GFR: 64.35 mL/min (ref >60.00)
Glucose, Bld: 96 mg/dL (ref 70–99)
Potassium: 4.4 mEq/L (ref 3.5–5.1)
Sodium: 140 mEq/L (ref 135–145)
Total Bilirubin: 0.8 mg/dL (ref 0.2–1.2)
Total Protein: 7.2 g/dL (ref 6.0–8.3)

## 2021-01-02 MED ORDER — ESCITALOPRAM OXALATE 20 MG PO TABS: 20.0000 mg | ORAL_TABLET | Freq: Every day | ORAL | 3 refills | Status: DC

## 2021-01-02 MED ORDER — DILTIAZEM HCL ER COATED BEADS 120 MG PO CP24: 120.0000 mg | ORAL_CAPSULE | Freq: Every day | ORAL | 2 refills | Status: DC

## 2021-01-02 NOTE — Progress Notes (Addendum)
Subjective:   By signing my name below, I, Michaela Martinez, attest that this documentation has been prepared under the direction and in the presence of Dr. Roma Schanz, DO. 01/02/2021    Patient ID: Michaela Martinez, female    DOB: 01/21/1946, 75 y.o.   MRN: [id number]  Chief Complaint  Patient presents with   Hypertension   Hyperlipidemia   Follow-up    HPI Patient is in today for a office visit. Her blood pressure is elevated during this visit. She notes this is probably because she is tired from walking to the room. Her legs swelling is doing better during this visit. She continues taking 100 mg metoprolol succinate daily PO, 100 mg losartan daily PO, and 50 mg aldactone daily PO and reports not having no new issues. She is controlling her salt intake.  BP Readings from Last 3 Encounters:  01/02/21 (!) 160/100  09/29/19 (!) 160/90  03/30/19 (!) 160/80   She reports having occasional pain in her knees and hips due to arthritis. When her pain flares up she takes OTC tylenol or aspirin to manage her symptoms and reports doing well on them.  Her Lexapro dosage was reduced from 20 mg to 10 mg and she does not know why. She reports that she does not feel relaxed anymore when taking losartan and instead feels slightly anxious since switching dosages.  She reports that her husband has passed away from lung cancer in April, 2022. She has not went to counseling offered by his hospice care because she thinks she does not need it at this time.  She has the [place] and [employer] vaccine and is willing to get the booster vaccines at a later time.   Past Medical History:  Diagnosis Date   Depression    Hypertension    Menopausal syndrome     Past Surgical History:  Procedure Laterality Date   INGUINAL HERNIA REPAIR  2000   TUBAL LIGATION  1970    Family History  Problem Relation Age of Onset   Pancreatic cancer Mother    Cirrhosis Brother    Diabetes Other    Hypertension  Other    Liver disease Other    Pancreatic cancer Other    Alcohol abuse Other    Depression Other    Asthma Son    Breast cancer Maternal Grandmother     Social History   Socioeconomic History   Marital status: Married    Spouse name: Not on file   Number of children: Not on file   Years of education: Not on file   Highest education level: Not on file  Occupational History   Not on file  Tobacco Use   Smoking status: Never   Smokeless tobacco: Never  Substance and Sexual Activity   Alcohol use: No   Drug use: No   Sexual activity: Not Currently    Partners: Male  Other Topics Concern   Not on file  Social History Narrative   Not on file   Social Determinants of Health   Financial Resource Strain: Not on file  Food Insecurity: Not on file  Transportation Needs: Not on file  Physical Activity: Not on file  Stress: Not on file  Social Connections: Not on file  Intimate Partner Violence: Not on file    Outpatient Medications Prior to Visit  Medication Sig Dispense Refill   buPROPion (WELLBUTRIN XL) 300 MG 24 hr tablet Take 1 tablet by mouth once daily 90 tablet 0  Cholecalciferol (VITAMIN D) 2000 units CAPS 1 po qd 30 capsule    fluticasone (FLONASE) 50 MCG/ACT nasal spray Place 2 sprays into both nostrils daily. 16 g 2   losartan (COZAAR) 100 MG tablet TAKE 1 TABLET BY MOUTH ONCE DAILY **DISCONTINUE  LOSARTAN  50  MG** 90 tablet 0   metoprolol succinate (TOPROL-XL) 100 MG 24 hr tablet TAKE 1 TABLET BY MOUTH ONCE DAILY --TAKE  WITH  OR  IMMEDIATELY  FOLLOWING  A  MEAL 90 tablet 0   rosuvastatin (CRESTOR) 20 MG tablet Take 1 tablet (20 mg total) by mouth daily. 90 tablet 3   spironolactone (ALDACTONE) 50 MG tablet Take 1 tablet (50 mg total) by mouth daily. 90 tablet 3   triamcinolone cream (KENALOG) 0.5 % Apply 1 application topically 3 (three) times daily. 30 g 1   Vitamin D, Ergocalciferol, (DRISDOL) 1.25 MG (50000 UNIT) CAPS capsule Take 1 capsule (50,000 Units  total) by mouth every 7 (seven) days. 12 capsule 1   escitalopram (LEXAPRO) 10 MG tablet TAKE 1 TABLET BY MOUTH AT BEDTIME 90 tablet 0   escitalopram (LEXAPRO) 20 MG tablet Take 1 tablet (20 mg total) by mouth daily. 90 tablet 3   No facility-administered medications prior to visit.    Allergies  Allergen Reactions   Norvasc [Amlodipine Besylate] Other (See Comments)    Makes  Me feel crazy and dizziness   Lipitor [Atorvastatin Calcium] Other (See Comments)    Night mares    Review of Systems  Constitutional:  Negative for fever and malaise/fatigue.  HENT:  Negative for congestion.   Eyes:  Negative for blurred vision.  Respiratory:  Negative for cough and shortness of breath.   Cardiovascular:  Positive for leg swelling. Negative for chest pain and palpitations.  Gastrointestinal:  Negative for abdominal pain, blood in stool, nausea and vomiting.  Genitourinary:  Negative for dysuria and frequency.  Musculoskeletal:  Negative for back pain and falls.  Skin:  Negative for rash.  Neurological:  Negative for dizziness, loss of consciousness and headaches.  Endo/Heme/Allergies:  Negative for environmental allergies.  Psychiatric/Behavioral:  Negative for depression. The patient is not nervous/anxious.       Objective:    Physical Exam Vitals and nursing note reviewed.  Constitutional:      General: She is not in acute distress.    Appearance: Normal appearance. She is not ill-appearing.  HENT:     Head: Normocephalic and atraumatic.     Right Ear: External ear normal.     Left Ear: External ear normal.  Eyes:     Extraocular Movements: Extraocular movements intact.     Pupils: Pupils are equal, round, and reactive to light.  Cardiovascular:     Rate and Rhythm: Normal rate and regular rhythm.     Heart sounds: Normal heart sounds. No murmur heard.   No gallop.     Comments: Her blood pressure measured 160/100 during the PE Pulmonary:     Effort: Pulmonary effort is  normal. No respiratory distress.     Breath sounds: Normal breath sounds. No wheezing or rales.  Musculoskeletal:     Right lower leg: 1+ Edema present.     Left lower leg: 1+ Edema present.  Skin:    General: Skin is warm and dry.  Neurological:     Mental Status: She is alert and oriented to person, place, and time.  Psychiatric:        Behavior: Behavior normal.  Judgment: Judgment normal.    BP (!) 160/100 (BP Location: Left Arm, Patient Position: Sitting, Cuff Size: Normal)   Pulse 79   Temp 98.4 F (36.9 C) (Oral)   Resp 18   Ht '5\' 7"'$  (1.702 m)   Wt 264 lb 12.8 oz (120.1 kg)   SpO2 95%   BMI 41.47 kg/m  Wt Readings from Last 3 Encounters:  01/02/21 264 lb 12.8 oz (120.1 kg)  09/29/19 263 lb 12.8 oz (119.7 kg)  03/30/19 259 lb (117.5 kg)  Repeat bp was same   Diabetic Foot Exam - Simple   No data filed    Lab Results  Component Value Date   WBC 7.9 09/29/2019   HGB 12.8 09/29/2019   HCT 40.2 09/29/2019   PLT 228.0 09/29/2019   GLUCOSE 86 09/29/2019   CHOL 120 09/29/2019   TRIG 82.0 09/29/2019   HDL 44.50 09/29/2019   LDLDIRECT 148.0 10/14/2010   LDLCALC 59 09/29/2019   ALT 12 09/29/2019   AST 13 09/29/2019   NA 140 09/29/2019   K 4.2 09/29/2019   CL 101 09/29/2019   CREATININE 0.87 09/29/2019   BUN 13 09/29/2019   CO2 33 (H) 09/29/2019   TSH 1.23 03/30/2019   HGBA1C 6.0 07/12/2014   MICROALBUR 0.8 11/15/2014    Lab Results  Component Value Date   TSH 1.23 03/30/2019   Lab Results  Component Value Date   WBC 7.9 09/29/2019   HGB 12.8 09/29/2019   HCT 40.2 09/29/2019   MCV 85.4 09/29/2019   PLT 228.0 09/29/2019   Lab Results  Component Value Date   NA 140 09/29/2019   K 4.2 09/29/2019   CO2 33 (H) 09/29/2019   GLUCOSE 86 09/29/2019   BUN 13 09/29/2019   CREATININE 0.87 09/29/2019   BILITOT 0.4 09/29/2019   ALKPHOS 79 09/29/2019   AST 13 09/29/2019   ALT 12 09/29/2019   PROT 7.1 09/29/2019   ALBUMIN 4.1 09/29/2019   CALCIUM  9.2 09/29/2019   GFR 77.42 09/29/2019   Lab Results  Component Value Date   CHOL 120 09/29/2019   Lab Results  Component Value Date   HDL 44.50 09/29/2019   Lab Results  Component Value Date   LDLCALC 59 09/29/2019   Lab Results  Component Value Date   TRIG 82.0 09/29/2019   Lab Results  Component Value Date   CHOLHDL 3 09/29/2019   Lab Results  Component Value Date   HGBA1C 6.0 07/12/2014       Assessment & Plan:   Problem List Items Addressed This Visit       Unprioritized   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 Report concerning symptoms and follow up as directed and as needed       Relevant Medications   diltiazem (CARTIA XT) 120 MG 24 hr capsule   Hyperlipidemia    Encourage heart healthy diet such as MIND or DASH diet, increase exercise, avoid trans fats, simple carbohydrates and processed foods, consider a  krill or fish or flaxseed oil cap daily.        Relevant Medications   diltiazem (CARTIA XT) 120 MG 24 hr capsule   Other Relevant Orders   Lipid panel   Comprehensive metabolic panel   Other Visit Diagnoses     Primary hypertension    -  Primary   Relevant Medications   diltiazem (CARTIA XT) 120 MG 24 hr capsule   Other Relevant Orders   Lipid panel   Comprehensive metabolic panel   Anxiety and depression       Relevant Medications   escitalopram (LEXAPRO) 20 MG tablet        Meds ordered this encounter  Medications   escitalopram (LEXAPRO) 20 MG tablet    Sig:  Take 1 tablet (20 mg total) by mouth daily.    Dispense:  90 tablet    Refill:  3   diltiazem (CARTIA XT) 120 MG 24 hr capsule    Sig: Take 1 capsule (120 mg total) by mouth daily.    Dispense:  30 capsule    Refill:  2    I, Dr. Roma Schanz, DO, personally preformed the services described in this documentation.  All medical record entries made by the scribe were at my direction and in my presence.  I have reviewed the chart and discharge instructions (if applicable) and agree that the record reflects my personal performance and is accurate and complete. 01/02/2021   I,Michaela Martinez,acting as a scribe for [employer], DO.,have documented all relevant documentation on the behalf of Ann Held, DO,as directed by  Ann Held, DO while in the presence of Ann Held, DO.   Ann Held, DO

## 2021-01-02 NOTE — Assessment & Plan Note (Signed)
Encourage heart healthy diet such as MIND or DASH diet, increase exercise, avoid trans fats, simple carbohydrates and processed foods, consider a krill or fish or flaxseed oil cap daily.  °

## 2021-01-02 NOTE — Assessment & Plan Note (Signed)
Stable con't meds 

## 2021-01-02 NOTE — Assessment & Plan Note (Signed)
Poorly controlled will alter medications, encouraged DASH diet, minimize caffeine and obtain adequate sleep. Report concerning symptoms and follow up as directed and as needed 

## 2021-01-02 NOTE — Patient Instructions (Signed)
https://www.nhlbi.nih.gov/files/docs/public/heart/dash_brief.pdf">  DASH Eating Plan DASH stands for Dietary Approaches to Stop Hypertension. The DASH eating plan is a healthy eating plan that has been shown to: Reduce high blood pressure (hypertension). Reduce your risk for type 2 diabetes, heart disease, and stroke. Help with weight loss. What are tips for following this plan? Reading food labels Check food labels for the amount of salt (sodium) per serving. Choose foods with less than 5 percent of the Daily Value of sodium. Generally, foods with less than 300 milligrams (mg) of sodium per serving fit into this eating plan. To find whole grains, look for the word "whole" as the first word in the ingredient list. Shopping Buy products labeled as "low-sodium" or "no salt added." Buy fresh foods. Avoid canned foods and pre-made or frozen meals. Cooking Avoid adding salt when cooking. Use salt-free seasonings or herbs instead of table salt or sea salt. Check with your health care provider or pharmacist before using salt substitutes. Do not fry foods. Cook foods using healthy methods such as baking, boiling, grilling, roasting, and broiling instead. Cook with heart-healthy oils, such as olive, canola, avocado, soybean, or sunflower oil. Meal planning  Eat a balanced diet that includes: 4 or more servings of fruits and 4 or more servings of vegetables each day. Try to fill one-half of your plate with fruits and vegetables. 6-8 servings of whole grains each day. Less than 6 oz (170 g) of lean meat, poultry, or fish each day. A 3-oz (85-g) serving of meat is about the same size as a deck of cards. One egg equals 1 oz (28 g). 2-3 servings of low-fat dairy each day. One serving is 1 cup (237 mL). 1 serving of nuts, seeds, or beans 5 times each week. 2-3 servings of heart-healthy fats. Healthy fats called omega-3 fatty acids are found in foods such as walnuts, flaxseeds, fortified milks, and eggs.  These fats are also found in cold-water fish, such as sardines, salmon, and mackerel. Limit how much you eat of: Canned or prepackaged foods. Food that is high in trans fat, such as some fried foods. Food that is high in saturated fat, such as fatty meat. Desserts and other sweets, sugary drinks, and other foods with added sugar. Full-fat dairy products. Do not salt foods before eating. Do not eat more than 4 egg yolks a week. Try to eat at least 2 vegetarian meals a week. Eat more home-cooked food and less restaurant, buffet, and fast food.  Lifestyle When eating at a restaurant, ask that your food be prepared with less salt or no salt, if possible. If you drink alcohol: Limit how much you use to: 0-1 drink a day for women who are not pregnant. 0-2 drinks a day for men. Be aware of how much alcohol is in your drink. In the [place] one drink equals one 12 oz bottle of beer (355 mL), one 5 oz glass of wine (148 mL), or one 1 oz glass of hard liquor (44 mL). General information Avoid eating more than 2,300 mg of salt a day. If you have hypertension, you may need to reduce your sodium intake to 1,500 mg a day. Work with your health care provider to maintain a healthy body weight or to lose weight. Ask what an ideal weight is for you. Get at least 30 minutes of exercise that causes your heart to beat faster (aerobic exercise) most days of the week. Activities may include walking, swimming, or biking. Work with your health care provider   or dietitian to adjust your eating plan to your individual calorie needs. What foods should I eat? Fruits All fresh, dried, or frozen fruit. Canned fruit in natural juice (without addedsugar). Vegetables Fresh or frozen vegetables (raw, steamed, roasted, or grilled). Low-sodium or reduced-sodium tomato and vegetable juice. Low-sodium or reduced-sodium tomatosauce and tomato paste. Low-sodium or reduced-sodium canned vegetables. Grains Whole-grain or  whole-wheat bread. Whole-grain or whole-wheat pasta. Brown rice. Oatmeal. Quinoa. Bulgur. Whole-grain and low-sodium cereals. Pita bread.Low-fat, low-sodium crackers. Whole-wheat flour tortillas. Meats and other proteins Skinless chicken or turkey. Ground chicken or turkey. Pork with fat trimmed off. Fish and seafood. Egg whites. Dried beans, peas, or lentils. Unsalted nuts, nut butters, and seeds. Unsalted canned beans. Lean cuts of beef with fat trimmed off. Low-sodium, lean precooked or cured meat, such as sausages or meatloaves. Dairy Low-fat (1%) or fat-free (skim) milk. Reduced-fat, low-fat, or fat-free cheeses. Nonfat, low-sodium ricotta or cottage cheese. Low-fat or nonfatyogurt. Low-fat, low-sodium cheese. Fats and oils Soft margarine without trans fats. Vegetable oil. Reduced-fat, low-fat, or light mayonnaise and salad dressings (reduced-sodium). Canola, safflower, olive, avocado, soybean, andsunflower oils. Avocado. Seasonings and condiments Herbs. Spices. Seasoning mixes without salt. Other foods Unsalted popcorn and pretzels. Fat-free sweets. The items listed above may not be a complete list of foods and beverages you can eat. Contact a dietitian for more information. What foods should I avoid? Fruits Canned fruit in a light or heavy syrup. Fried fruit. Fruit in cream or buttersauce. Vegetables Creamed or fried vegetables. Vegetables in a cheese sauce. Regular canned vegetables (not low-sodium or reduced-sodium). Regular canned tomato sauce and paste (not low-sodium or reduced-sodium). Regular tomato and vegetable juice(not low-sodium or reduced-sodium). Pickles. Olives. Grains Baked goods made with fat, such as croissants, muffins, or some breads. Drypasta or rice meal packs. Meats and other proteins Fatty cuts of meat. Ribs. Fried meat. Bacon. Bologna, salami, and other precooked or cured meats, such as sausages or meat loaves. Fat from the back of a pig (fatback). Bratwurst.  Salted nuts and seeds. Canned beans with added salt. Canned orsmoked fish. Whole eggs or egg yolks. Chicken or turkey with skin. Dairy Whole or 2% milk, cream, and half-and-half. Whole or full-fat cream cheese. Whole-fat or sweetened yogurt. Full-fat cheese. Nondairy creamers. Whippedtoppings. Processed cheese and cheese spreads. Fats and oils Butter. Stick margarine. Lard. Shortening. Ghee. Bacon fat. Tropical oils, suchas coconut, palm kernel, or palm oil. Seasonings and condiments Onion salt, garlic salt, seasoned salt, table salt, and sea salt. Worcestershire sauce. Tartar sauce. Barbecue sauce. Teriyaki sauce. Soy sauce, including reduced-sodium. Steak sauce. Canned and packaged gravies. Fish sauce. Oyster sauce. Cocktail sauce. Store-bought horseradish. Ketchup. Mustard. Meat flavorings and tenderizers. Bouillon cubes. Hot sauces. Pre-made or packaged marinades. Pre-made or packaged taco seasonings. Relishes. Regular saladdressings. Other foods Salted popcorn and pretzels. The items listed above may not be a complete list of foods and beverages you should avoid. Contact a dietitian for more information. Where to find more information National Heart, Lung, and Blood Institute: www.nhlbi.nih.gov American Heart Association: www.heart.org Academy of Nutrition and Dietetics: www.eatright.org National Kidney Foundation: www.kidney.org Summary The DASH eating plan is a healthy eating plan that has been shown to reduce high blood pressure (hypertension). It may also reduce your risk for type 2 diabetes, heart disease, and stroke. When on the DASH eating plan, aim to eat more fresh fruits and vegetables, whole grains, lean proteins, low-fat dairy, and heart-healthy fats. With the DASH eating plan, you should limit salt (sodium) intake to 2,300   mg a day. If you have hypertension, you may need to reduce your sodium intake to 1,500 mg a day. Work with your health care provider or dietitian to adjust  your eating plan to your individual calorie needs. This information is not intended to replace advice given to you by your health care provider. Make sure you discuss any questions you have with your healthcare provider. Document Revised: 04/28/2019 Document Reviewed: 04/28/2019 Elsevier Patient Education  2022 [employer]  

## 2021-01-05 ENCOUNTER — Other Ambulatory Visit: Payer: Self-pay | Admitting: Family Medicine

## 2021-01-05 DIAGNOSIS — E785 Hyperlipidemia, unspecified: Secondary | ICD-10-CM

## 2021-01-14 ENCOUNTER — Other Ambulatory Visit: Payer: Self-pay | Admitting: *Deleted

## 2021-01-20 ENCOUNTER — Other Ambulatory Visit: Payer: Self-pay | Admitting: Family Medicine

## 2021-01-20 DIAGNOSIS — E785 Hyperlipidemia, unspecified: Secondary | ICD-10-CM

## 2021-03-02 ENCOUNTER — Other Ambulatory Visit: Payer: Self-pay | Admitting: Family Medicine

## 2021-03-02 DIAGNOSIS — I10 Essential (primary) hypertension: Secondary | ICD-10-CM

## 2021-03-02 DIAGNOSIS — F419 Anxiety disorder, unspecified: Secondary | ICD-10-CM

## 2021-03-04 ENCOUNTER — Other Ambulatory Visit: Payer: Self-pay | Admitting: *Deleted

## 2021-03-04 DIAGNOSIS — F419 Anxiety disorder, unspecified: Secondary | ICD-10-CM

## 2021-03-04 DIAGNOSIS — F32A Depression, unspecified: Secondary | ICD-10-CM

## 2021-03-04 MED ORDER — ESCITALOPRAM OXALATE 20 MG PO TABS: 20.0000 mg | ORAL_TABLET | Freq: Every day | ORAL | 3 refills | Status: DC

## 2021-03-10 ENCOUNTER — Ambulatory Visit (INDEPENDENT_AMBULATORY_CARE_PROVIDER_SITE_OTHER): Payer: Medicare PPO

## 2021-03-10 VITALS — Ht 67.0 in | Wt 264.0 lb

## 2021-03-10 DIAGNOSIS — Z780 Asymptomatic menopausal state: Secondary | ICD-10-CM

## 2021-03-10 DIAGNOSIS — Z0000 Encounter for general adult medical examination without abnormal findings: Secondary | ICD-10-CM

## 2021-03-10 DIAGNOSIS — Z1231 Encounter for screening mammogram for malignant neoplasm of breast: Secondary | ICD-10-CM | POA: Diagnosis not present

## 2021-03-10 NOTE — Progress Notes (Signed)
Subjective:   Michaela Martinez is a 75 y.o. female who presents for Medicare Annual (Subsequent) preventive examination.  I connected with Michaela Martinez today by telephone and verified that I am speaking with the correct person using two identifiers. Location patient: home Location provider: work Persons participating in the virtual visit: patient, Marine scientist.    I discussed the limitations, risks, security and privacy concerns of performing an evaluation and management service by telephone and the availability of in person appointments. I also discussed with the patient that there may be a patient responsible charge related to this service. The patient expressed understanding and verbally consented to this telephonic visit.    Interactive audio and video telecommunications were attempted between this provider and patient, however failed, due to patient having technical difficulties OR patient did not have access to video capability.  We continued and completed visit with audio only.  Some vital signs may be absent or patient reported.   Time Spent with patient on telephone encounter: 25 minutes   Review of Systems     Cardiac Risk Factors include: advanced age (>49men, >42 women);obesity (BMI >30kg/m2);dyslipidemia;hypertension     Objective:    Today's Vitals   03/10/21 1421  Weight: 264 lb (119.7 kg)  Height: 5\' 7"  (1.702 m)   Body mass index is 41.35 kg/m.  Advanced Directives 03/10/2021 08/03/2016  Does Patient Have a Medical Advance Directive? No No  Would patient like information on creating a medical advance directive? Yes (MAU/Ambulatory/Procedural Areas - Information given) No - Patient declined    Current Medications (verified) Outpatient Encounter Medications as of 03/10/2021  Medication Sig   buPROPion (WELLBUTRIN XL) 300 MG 24 hr tablet Take 1 tablet by mouth once daily   Cholecalciferol (VITAMIN D) 2000 units CAPS 1 po qd   diltiazem (CARTIA XT) 120 MG 24 hr capsule Take 1  capsule (120 mg total) by mouth daily.   escitalopram (LEXAPRO) 20 MG tablet Take 1 tablet (20 mg total) by mouth daily.   fluticasone (FLONASE) 50 MCG/ACT nasal spray Place 2 sprays into both nostrils daily.   losartan (COZAAR) 100 MG tablet Take 1 tablet (100 mg total) by mouth daily.   metoprolol succinate (TOPROL-XL) 100 MG 24 hr tablet TAKE 1 TABLET BY MOUTH ONCE DAILY --TAKE  WITH  OR  IMMEDIATELY  FOLLOWING  A  MEAL   rosuvastatin (CRESTOR) 20 MG tablet TAKE 1 TABLET BY MOUTH ONCE DAILY --DOSE  INCREASE   spironolactone (ALDACTONE) 50 MG tablet Take 1 tablet (50 mg total) by mouth daily.   triamcinolone cream (KENALOG) 0.5 % Apply 1 application topically 3 (three) times daily.   Vitamin D, Ergocalciferol, (DRISDOL) 1.25 MG (50000 UNIT) CAPS capsule Take 1 capsule (50,000 Units total) by mouth every 7 (seven) days.   No facility-administered encounter medications on file as of 03/10/2021.    Allergies (verified) Norvasc [amlodipine besylate] and Lipitor [atorvastatin calcium]   History: Past Medical History:  Diagnosis Date   Depression    Hypertension    Menopausal syndrome    Past Surgical History:  Procedure Laterality Date   INGUINAL HERNIA REPAIR  2000   TUBAL LIGATION  1970   Family History  Problem Relation Age of Onset   Pancreatic cancer Mother    Cirrhosis Brother    Diabetes Other    Hypertension Other    Liver disease Other    Pancreatic cancer Other    Alcohol abuse Other    Depression Other    Asthma Son  Breast cancer Maternal Grandmother    Social History   Socioeconomic History   Marital status: Widowed    Spouse name: Not on file   Number of children: Not on file   Years of education: Not on file   Highest education level: Not on file  Occupational History   Not on file  Tobacco Use   Smoking status: Never   Smokeless tobacco: Never  Substance and Sexual Activity   Alcohol use: No   Drug use: No   Sexual activity: Not Currently     Partners: Male  Other Topics Concern   Not on file  Social History Narrative   Not on file   Social Determinants of Health   Financial Resource Strain: Low Risk    Difficulty of Paying Living Expenses: Not very hard  Food Insecurity: No Food Insecurity   Worried About Running Out of Food in the Last Year: Never true   Ran Out of Food in the Last Year: Never true  Transportation Needs: No Transportation Needs   Lack of Transportation (Medical): No   Lack of Transportation (Non-Medical): No  Physical Activity: Insufficiently Active   Days of Exercise per Week: 2 days   Minutes of Exercise per Session: 30 min  Stress: No Stress Concern Present   Feeling of Stress : Only a little  Social Connections: Moderately Isolated   Frequency of Communication with Friends and Family: More than three times a week   Frequency of Social Gatherings with Friends and Family: More than three times a week   Attends Religious Services: More than 4 times per year   Active Member of [employer] or Organizations: No   Attends Archivist Meetings: Never   Marital Status: Widowed    Tobacco Counseling Counseling given: Not Answered   Clinical Intake:  Pre-visit preparation completed: Yes  Pain : No/denies pain     BMI - recorded: 41.35 Nutritional Status: BMI > 30  Obese Nutritional Risks: None Diabetes: No  How often do you need to have someone help you when you read instructions, pamphlets, or other written materials from your doctor or pharmacy?: 1 - Never  Diabetic?No  Interpreter Needed?: No  Information entered by :: Caroleen Hamman LPN   Activities of Daily Living In your present state of health, do you have any difficulty performing the following activities: 03/10/2021 01/02/2021  Hearing? N N  Vision? N N  Difficulty concentrating or making decisions? N N  Walking or climbing stairs? N Y  Dressing or bathing? N N  Doing errands, shopping? N N  Preparing Food and eating ?  N -  Using the Toilet? N -  In the past six months, have you accidently leaked urine? N -  Do you have problems with loss of bowel control? N -  Managing your Medications? N -  Managing your Finances? N -  Housekeeping or managing your Housekeeping? N -  Some recent data might be hidden    Patient Care Team: Carollee Herter, Alferd Apa, DO as PCP - General  Indicate any recent Medical Services you may have received from other than [facility] providers in the past year (date may be approximate).     Assessment:   This is a routine wellness examination for Michaela Martinez.  Hearing/Vision screen Hearing Screening - Comments:: No issues Vision Screening - Comments:: Last eye exam-2 years ago-[facility]  Dietary issues and exercise activities discussed: Current Exercise Habits: Home exercise routine, Type of exercise: walking, Time (  Minutes): 30, Frequency (Times/Week): 2, Weekly Exercise (Minutes/Week): 60, Intensity: Mild   Goals Addressed             This Visit's Progress    Patient Stated       Increase activity       Depression Screen PHQ 2/9 Scores 03/10/2021 09/29/2019 03/30/2019 02/17/2018 08/03/2016 03/06/2016 11/15/2014  PHQ - 2 Score 1 0 0 4 0 0 0  PHQ- 9 Score - - 0 14 - - -    Fall Risk Fall Risk  03/10/2021 01/02/2021 09/29/2019 02/17/2018 08/03/2016  Falls in the past year? 1 0 0 No No  Number falls in past yr: 0 0 0 - -  Injury with Fall? 0 0 0 - -  Risk for fall due to : History of fall(s) Impaired balance/gait;Impaired mobility - - -  Follow up Falls prevention discussed Falls evaluation completed Falls evaluation completed - -    FALL RISK PREVENTION PERTAINING TO THE HOME:  Any stairs in or around the home? No  Home free of loose throw rugs in walkways, pet beds, electrical cords, etc? Yes  Adequate lighting in your home to reduce risk of falls? Yes   ASSISTIVE DEVICES UTILIZED TO PREVENT FALLS:  Life alert? No  Use of a cane, walker or w/c? No  Grab bars in the  bathroom? No  Shower chair or bench in shower? Yes  Elevated toilet seat or a handicapped toilet? No   TIMED UP AND GO:  Was the test performed? No . Phone visit   Cognitive Function:Normal cognitive status assessed by this Nurse Health Advisor. No abnormalities found.   MMSE - Mini Mental State Exam 08/03/2016  Orientation to time 5  Orientation to Place 5  Registration 3  Attention/ Calculation 4  Recall 3  Language- name 2 objects 2  Language- repeat 1  Language- follow 3 step command 3  Language- read & follow direction 1  Write a sentence 1  Copy design 1  Total score 29        Immunizations Immunization History  Administered Date(s) Administered   Fluad Quad(high Dose 65+) 03/30/2019   Influenza, High Dose Seasonal PF 02/11/2017, 02/17/2018   Influenza,inj,Quad PF,6+ Mos 04/12/2013, 07/12/2014, 08/03/2016   Janssen (J&J) SARS-COV-2 Vaccination 09/18/2019   Pneumococcal Conjugate-13 07/12/2014   Pneumococcal Polysaccharide-23 10/06/2012   Td 06/09/2003   Tdap 10/08/2015   Zoster Recombinat (Shingrix) 02/11/2017   Zoster, Live 10/08/2015    TDAP status: Up to date  Flu Vaccine status: Due, Education has been provided regarding the importance of this vaccine. Advised may receive this vaccine at local pharmacy or Health Dept. Aware to provide a copy of the vaccination record if obtained from local pharmacy or Health Dept. Verbalized acceptance and understanding.  Pneumococcal vaccine status: Up to date  Covid-19 vaccine status: Information provided on how to obtain vaccines. Booster due  Qualifies for Shingles Vaccine? No   Zostavax completed Yes   Shingrix Completed?: Yes  Screening Tests Health Maintenance  Topic Date Due   COLONOSCOPY (Pts 45-44yrs Insurance coverage will need to be confirmed)  01/04/2013   Zoster Vaccines- Shingrix (2 of 2) 04/08/2017   MAMMOGRAM  08/29/2017   COVID-19 Vaccine (2 - Booster for Janssen series) 11/13/2019   INFLUENZA  VACCINE  01/06/2021   TETANUS/TDAP  10/07/2025   DEXA SCAN  Completed   Hepatitis C Screening  Completed   HPV VACCINES  Aged Out    Health Maintenance  Health Maintenance Due  Topic Date Due   COLONOSCOPY (Pts 45-77yrs Insurance coverage will need to be confirmed)  01/04/2013   Zoster Vaccines- Shingrix (2 of 2) 04/08/2017   MAMMOGRAM  08/29/2017   COVID-19 Vaccine (2 - Booster for Janssen series) 11/13/2019   INFLUENZA VACCINE  01/06/2021    Colorectal cancer screening: Due-Patient to call GI to schedule.  Mammogram status: Ordered today. Pt provided with contact info and advised to call to schedule appt.   Bone Density status: Ordered today. Pt provided with contact info and advised to call to schedule appt.  Lung Cancer Screening: (Low Dose CT Chest recommended if Age 22-80 years, 30 pack-year currently smoking OR have quit w/in 15 years. does not qualify.    Additional Screening:  Hepatitis C Screening: Completed 08/15/2015  Vision Screening: Recommended annual ophthalmology exams for early detection of glaucoma and other disorders of the eye. Is the patient up to date with their annual eye exam?  No  Who is the provider or what is the name of the office in which the patient attends annual eye exams? [facility]   Dental Screening: Recommended annual dental exams for proper oral hygiene  Community Resource Referral / Chronic Care Management: CRR required this visit?  No   CCM required this visit?  No      Plan:     I have personally reviewed and noted the following in the patient's chart:   Medical and social history Use of alcohol, tobacco or illicit drugs  Current medications and supplements including opioid prescriptions.  Functional ability and status Nutritional status Physical activity Advanced directives List of other physicians Hospitalizations, surgeries, and ER visits in previous 12 months Vitals Screenings to include cognitive,  depression, and falls Referrals and appointments  In addition, I have reviewed and discussed with patient certain preventive protocols, quality metrics, and best practice recommendations. A written personalized care plan for preventive services as well as general preventive health recommendations were provided to patient.   Due to this being a telephonic visit, the after visit summary with patients personalized plan was offered to patient via mail or my-chart. Patient would like to access on my-chart.   Marta Antu, LPN   [id number]/06/[id number]  Nurse Health Advisor  Nurse Notes: None

## 2021-03-10 NOTE — Patient Instructions (Signed)
Michaela Martinez , Thank you for taking time to complete your Medicare Wellness Visit. I appreciate your ongoing commitment to your health goals. Please review the following plan we discussed and let me know if I can assist you in the future.   Screening recommendations/referrals: Colonoscopy: Due-Per our conversation, you will call GI to schedule. Mammogram: Scheduled today. Someone will call you to schedule. Bone Density: Scheduled today. Someone will call you to schedule. Recommended yearly ophthalmology/optometry visit for glaucoma screening and checkup Recommended yearly dental visit for hygiene and checkup  Vaccinations: Influenza vaccine: Due-May obtain vaccine at our house or your local pharmacy. Pneumococcal vaccine: Up to date Tdap vaccine: Up to date-Due-10/07/2025 Shingles vaccine: Discuss with pharmacy . Covid-19:Booster available at the pharmacy.  Advanced directives: Information mailed today.  Conditions/risks identified: See problem list  Next appointment: Follow up in one year for your annual wellness visit 03/12/2022 @ 11:00.   Preventive Care 38 Years and Older, Female Preventive care refers to lifestyle choices and visits with your health care provider that can promote health and wellness. What does preventive care include? A yearly physical exam. This is also called an annual well check. Dental exams once or twice a year. Routine eye exams. Ask your health care provider how often you should have your eyes checked. Personal lifestyle choices, including: Daily care of your teeth and gums. Regular physical activity. Eating a healthy diet. Avoiding tobacco and drug use. Limiting alcohol use. Practicing safe sex. Taking low-dose aspirin every day. Taking vitamin and mineral supplements as recommended by your health care provider. What happens during an annual well check? The services and screenings done by your health care provider during your annual well check will  depend on your age, overall health, lifestyle risk factors, and family history of disease. Counseling  Your health care provider may ask you questions about your: Alcohol use. Tobacco use. Drug use. Emotional well-being. Home and relationship well-being. Sexual activity. Eating habits. History of falls. Memory and ability to understand (cognition). Work and work Statistician. Reproductive health. Screening  You may have the following tests or measurements: Height, weight, and BMI. Blood pressure. Lipid and cholesterol levels. These may be checked every 5 years, or more frequently if you are over 11 years old. Skin check. Lung cancer screening. You may have this screening every year starting at age 69 if you have a 30-pack-year history of smoking and currently smoke or have quit within the past 15 years. Fecal occult blood test (FOBT) of the stool. You may have this test every year starting at age 77. Flexible sigmoidoscopy or colonoscopy. You may have a sigmoidoscopy every 5 years or a colonoscopy every 10 years starting at age 69. Hepatitis C blood test. Hepatitis B blood test. Sexually transmitted disease (STD) testing. Diabetes screening. This is done by checking your blood sugar (glucose) after you have not eaten for a while (fasting). You may have this done every 1-3 years. Bone density scan. This is done to screen for osteoporosis. You may have this done starting at age 39. Mammogram. This may be done every 1-2 years. Talk to your health care provider about how often you should have regular mammograms. Talk with your health care provider about your test results, treatment options, and if necessary, the need for more tests. Vaccines  Your health care provider may recommend certain vaccines, such as: Influenza vaccine. This is recommended every year. Tetanus, diphtheria, and acellular pertussis (Tdap, Td) vaccine. You may need a Td booster every 10  years. Zoster vaccine. You may  need this after age 74. Pneumococcal 13-valent conjugate (PCV13) vaccine. One dose is recommended after age 27. Pneumococcal polysaccharide (PPSV23) vaccine. One dose is recommended after age 91. Talk to your health care provider about which screenings and vaccines you need and how often you need them. This information is not intended to replace advice given to you by your health care provider. Make sure you discuss any questions you have with your health care provider. Document Released: 06/21/2015 Document Revised: 02/12/2016 Document Reviewed: 03/26/2015 Elsevier Interactive Patient Education  2017 [place] Prevention in the Home Falls can cause injuries. They can happen to people of all ages. There are many things you can do to make your home safe and to help prevent falls. What can I do on the outside of my home? Regularly fix the edges of walkways and driveways and fix any cracks. Remove anything that might make you trip as you walk through a door, such as a raised step or threshold. Trim any bushes or trees on the path to your home. Use bright outdoor lighting. Clear any walking paths of anything that might make someone trip, such as rocks or tools. Regularly check to see if handrails are loose or broken. Make sure that both sides of any steps have handrails. Any raised decks and porches should have guardrails on the edges. Have any leaves, snow, or ice cleared regularly. Use sand or salt on walking paths during winter. Clean up any spills in your garage right away. This includes oil or grease spills. What can I do in the bathroom? Use night lights. Install grab bars by the toilet and in the tub and shower. Do not use towel bars as grab bars. Use non-skid mats or decals in the tub or shower. If you need to sit down in the shower, use a plastic, non-slip stool. Keep the floor dry. Clean up any water that spills on the floor as soon as it happens. Remove soap buildup in  the tub or shower regularly. Attach bath mats securely with double-sided non-slip rug tape. Do not have throw rugs and other things on the floor that can make you trip. What can I do in the bedroom? Use night lights. Make sure that you have a light by your bed that is easy to reach. Do not use any sheets or blankets that are too big for your bed. They should not hang down onto the floor. Have a firm chair that has side arms. You can use this for support while you get dressed. Do not have throw rugs and other things on the floor that can make you trip. What can I do in the kitchen? Clean up any spills right away. Avoid walking on wet floors. Keep items that you use a lot in easy-to-reach places. If you need to reach something above you, use a strong step stool that has a grab bar. Keep electrical cords out of the way. Do not use floor polish or wax that makes floors slippery. If you must use wax, use non-skid floor wax. Do not have throw rugs and other things on the floor that can make you trip. What can I do with my stairs? Do not leave any items on the stairs. Make sure that there are handrails on both sides of the stairs and use them. Fix handrails that are broken or loose. Make sure that handrails are as long as the stairways. Check any carpeting to make sure  that it is firmly attached to the stairs. Fix any carpet that is loose or worn. Avoid having throw rugs at the top or bottom of the stairs. If you do have throw rugs, attach them to the floor with carpet tape. Make sure that you have a light switch at the top of the stairs and the bottom of the stairs. If you do not have them, ask someone to add them for you. What else can I do to help prevent falls? Wear shoes that: Do not have high heels. Have rubber bottoms. Are comfortable and fit you well. Are closed at the toe. Do not wear sandals. If you use a stepladder: Make sure that it is fully opened. Do not climb a closed  stepladder. Make sure that both sides of the stepladder are locked into place. Ask someone to hold it for you, if possible. Clearly mark and make sure that you can see: Any grab bars or handrails. First and last steps. Where the edge of each step is. Use tools that help you move around (mobility aids) if they are needed. These include: Canes. Walkers. Scooters. Crutches. Turn on the lights when you go into a dark area. Replace any light bulbs as soon as they burn out. Set up your furniture so you have a clear path. Avoid moving your furniture around. If any of your floors are uneven, fix them. If there are any pets around you, be aware of where they are. Review your medicines with your doctor. Some medicines can make you feel dizzy. This can increase your chance of falling. Ask your doctor what other things that you can do to help prevent falls. This information is not intended to replace advice given to you by your health care provider. Make sure you discuss any questions you have with your health care provider. Document Released: 03/21/2009 Document Revised: 10/31/2015 Document Reviewed: 06/29/2014 Elsevier Interactive Patient Education  2017 [employer].

## 2021-03-25 ENCOUNTER — Encounter: Payer: Self-pay | Admitting: Family Medicine

## 2021-03-25 ENCOUNTER — Ambulatory Visit (INDEPENDENT_AMBULATORY_CARE_PROVIDER_SITE_OTHER): Payer: Medicare PPO | Admitting: Family Medicine

## 2021-03-25 ENCOUNTER — Ambulatory Visit: Payer: Medicare PPO | Attending: Internal Medicine

## 2021-03-25 ENCOUNTER — Other Ambulatory Visit: Payer: Self-pay

## 2021-03-25 VITALS — BP 128/90 | HR 70 | Temp 98.0°F | Resp 20 | Ht 67.0 in | Wt 259.6 lb

## 2021-03-25 DIAGNOSIS — E785 Hyperlipidemia, unspecified: Secondary | ICD-10-CM

## 2021-03-25 DIAGNOSIS — I10 Essential (primary) hypertension: Secondary | ICD-10-CM

## 2021-03-25 DIAGNOSIS — Z0000 Encounter for general adult medical examination without abnormal findings: Secondary | ICD-10-CM | POA: Insufficient documentation

## 2021-03-25 DIAGNOSIS — M170 Bilateral primary osteoarthritis of knee: Secondary | ICD-10-CM

## 2021-03-25 DIAGNOSIS — Z23 Encounter for immunization: Secondary | ICD-10-CM | POA: Diagnosis not present

## 2021-03-25 DIAGNOSIS — E559 Vitamin D deficiency, unspecified: Secondary | ICD-10-CM | POA: Diagnosis not present

## 2021-03-25 DIAGNOSIS — R601 Generalized edema: Secondary | ICD-10-CM

## 2021-03-25 LAB — CBC WITH DIFFERENTIAL/PLATELET
Basophils Absolute: 0 10*3/uL (ref 0.0–0.1)
Basophils Relative: 0.3 % (ref 0.0–3.0)
Eosinophils Absolute: 0.1 10*3/uL (ref 0.0–0.7)
Eosinophils Relative: 0.9 % (ref 0.0–5.0)
HCT: 40 % (ref 36.0–46.0)
Hemoglobin: 12.5 g/dL (ref 12.0–15.0)
Lymphocytes Relative: 22.1 % (ref 12.0–46.0)
Lymphs Abs: 1.7 10*3/uL (ref 0.7–4.0)
MCHC: 31.4 g/dL (ref 30.0–36.0)
MCV: 85.3 fl (ref 78.0–100.0)
Monocytes Absolute: 0.6 10*3/uL (ref 0.1–1.0)
Monocytes Relative: 7.4 % (ref 3.0–12.0)
Neutro Abs: 5.4 10*3/uL (ref 1.4–7.7)
Neutrophils Relative %: 69.3 % (ref 43.0–77.0)
Platelets: 215 10*3/uL (ref 150.0–400.0)
RBC: 4.69 Mil/uL (ref 3.87–5.11)
RDW: 14.6 % (ref 11.5–15.5)
WBC: 7.8 10*3/uL (ref 4.0–10.5)

## 2021-03-25 LAB — LIPID PANEL
Cholesterol: 109 mg/dL (ref 0–200)
HDL: 44.2 mg/dL (ref >39.00)
LDL Cholesterol: 51 mg/dL (ref 0–99)
NonHDL: 64.89
Total CHOL/HDL Ratio: 2
Triglycerides: 70 mg/dL (ref 0.0–149.0)
VLDL: 14 mg/dL (ref 0.0–40.0)

## 2021-03-25 LAB — COMPREHENSIVE METABOLIC PANEL
ALT: 9 U/L (ref 0–35)
AST: 12 U/L (ref 0–37)
Albumin: 4 g/dL (ref 3.5–5.2)
Alkaline Phosphatase: 81 U/L (ref 39–117)
BUN: 10 mg/dL (ref 6–23)
CO2: 33 mEq/L — ABNORMAL HIGH (ref 19–32)
Calcium: 9.3 mg/dL (ref 8.4–10.5)
Chloride: 100 mEq/L (ref 96–112)
Creatinine, Ser: 0.84 mg/dL (ref 0.40–1.20)
GFR: 68.86 mL/min (ref >60.00)
Glucose, Bld: 81 mg/dL (ref 70–99)
Potassium: 4.4 mEq/L (ref 3.5–5.1)
Sodium: 139 mEq/L (ref 135–145)
Total Bilirubin: 0.5 mg/dL (ref 0.2–1.2)
Total Protein: 7 g/dL (ref 6.0–8.3)

## 2021-03-25 LAB — VITAMIN D 25 HYDROXY (VIT D DEFICIENCY, FRACTURES): VITD: 22.63 ng/mL — ABNORMAL LOW (ref 30.00–100.00)

## 2021-03-25 MED ORDER — CELECOXIB 200 MG PO CAPS: ORAL_CAPSULE | ORAL | 1 refills | Status: AC

## 2021-03-25 MED ORDER — VITAMIN D (ERGOCALCIFEROL) 1.25 MG (50000 UNIT) PO CAPS: 50000.0000 [IU] | ORAL_CAPSULE | ORAL | 1 refills | Status: AC

## 2021-03-25 NOTE — Progress Notes (Signed)
   Cov[facility] Vaccination [facility]  Name:  Emmerie Battaglia    MRN: [id number] DOB: [birth year]  03/25/2021  Ms. Schmoker was observed post Covid-19 immunization for 15 minutes without incident. She was provided with Vaccine Information Sheet and instruction to access the V-Safe system.   Ms. Sarnowski was instructed to call 911 with any severe reactions post vaccine: Difficulty breathing  Swelling of face and throat  A fast heartbeat  A bad rash all over body  Dizziness and weakness

## 2021-03-25 NOTE — Assessment & Plan Note (Signed)
Well controlled, no changes to meds. Encouraged heart healthy diet such as the DASH diet and exercise as tolerated.  °

## 2021-03-25 NOTE — Patient Instructions (Signed)
Preventive Care 40 Years and Older, Female Preventive care refers to lifestyle choices and visits with your health care provider that can promote health and wellness. This includes: A yearly physical exam. This is also called an annual wellness visit. Regular dental and eye exams. Immunizations. Screening for certain conditions. Healthy lifestyle choices, such as: Eating a healthy diet. Getting regular exercise. Not using drugs or products that contain nicotine and tobacco. Limiting alcohol use. What can I expect for my preventive care visit? Physical exam Your health care provider will check your: Height and weight. These may be used to calculate your BMI (body mass index). BMI is a measurement that tells if you are at a healthy weight. Heart rate and blood pressure. Body temperature. Skin for abnormal spots. Counseling Your health care provider may ask you questions about your: Past medical problems. Family's medical history. Alcohol, tobacco, and drug use. Emotional well-being. Home life and relationship well-being. Sexual activity. Diet, exercise, and sleep habits. History of falls. Memory and ability to understand (cognition). Work and work Statistician. Pregnancy and menstrual history. Access to firearms. What immunizations do I need? Vaccines are usually given at various ages, according to a schedule. Your health care provider will recommend vaccines for you based on your age, medical history, and lifestyle or other factors, such as travel or where you work. What tests do I need? Blood tests Lipid and cholesterol levels. These may be checked every 5 years, or more often depending on your overall health. Hepatitis C test. Hepatitis B test. Screening Lung cancer screening. You may have this screening every year starting at age 33 if you have a 30-pack-year history of smoking and currently smoke or have quit within the past 15 years. Colorectal cancer screening. All  adults should have this screening starting at age 73 and continuing until age 9. Your health care provider may recommend screening at age 31 if you are at increased risk. You will have tests every 1-10 years, depending on your results and the type of screening test. Diabetes screening. This is done by checking your blood sugar (glucose) after you have not eaten for a while (fasting). You may have this done every 1-3 years. Mammogram. This may be done every 1-2 years. Talk with your health care provider about how often you should have regular mammograms. Abdominal aortic aneurysm (AAA) screening. You may need this if you are a current or former smoker. BRCA-related cancer screening. This may be done if you have a family history of breast, ovarian, tubal, or peritoneal cancers. Other tests STD (sexually transmitted disease) testing, if you are at risk. Bone density scan. This is done to screen for osteoporosis. You may have this done starting at age 43. Talk with your health care provider about your test results, treatment options, and if necessary, the need for more tests. Follow these instructions at home: Eating and drinking  Eat a diet that includes fresh fruits and vegetables, whole grains, lean protein, and low-fat dairy products. Limit your intake of foods with high amounts of sugar, saturated fats, and salt. Take vitamin and mineral supplements as recommended by your health care provider. Do not drink alcohol if your health care provider tells you not to drink. If you drink alcohol: Limit how much you have to 0-1 drink a day. Be aware of how much alcohol is in your drink. In the [place] one drink equals one 12 oz bottle of beer (355 mL), one 5 oz glass of wine (148 mL), or one  1 oz glass of hard liquor (44 mL). Lifestyle Take daily care of your teeth and gums. Brush your teeth every morning and night with fluoride toothpaste. Floss one time each day. Stay active. Exercise for at least  30 minutes 5 or more days each week. Do not use any products that contain nicotine or tobacco, such as cigarettes, e-cigarettes, and chewing tobacco. If you need help quitting, ask your health care provider. Do not use drugs. If you are sexually active, practice safe sex. Use a condom or other form of protection in order to prevent STIs (sexually transmitted infections). Talk with your health care provider about taking a low-dose aspirin or statin. Find healthy ways to cope with stress, such as: Meditation, yoga, or listening to music. Journaling. Talking to a trusted person. Spending time with friends and family. Safety Always wear your seat belt while driving or riding in a vehicle. Do not drive: If you have been drinking alcohol. Do not ride with someone who has been drinking. When you are tired or distracted. While texting. Wear a helmet and other protective equipment during sports activities. If you have firearms in your house, make sure you follow all gun safety procedures. What's next? Visit your health care provider once a year for an annual wellness visit. Ask your health care provider how often you should have your eyes and teeth checked. Stay up to date on all vaccines. This information is not intended to replace advice given to you by your health care provider. Make sure you discuss any questions you have with your health care provider. Document Revised: 08/02/2020 Document Reviewed: 05/19/2018 Elsevier Patient Education  2022 [employer].

## 2021-03-25 NOTE — Progress Notes (Signed)
Subjective:   By signing my name below, I, [name], attest that this documentation has been prepared under the direction and in the presence of Dr. Roma Schanz, DO. 03/25/2021    Patient ID: Michaela Martinez, female    DOB: March 02, 1946, 75 y.o.   MRN: [id number]  Chief Complaint  Patient presents with   Annual Exam    Pt states not fasting     HPI Patient is in today for a comprehensive physical exam.  She complains of knee pain. She has trouble lift her legs up. She is taking OTC pain medication to manage her pain and finds mild relief. She is requesting a stronger medication to manage her pain. She has not seen a specialist to manage her pain and is not interested in seeing one at this time.  She complains that her environmental allergies are worsening. She thinks it is due to where she is staying at right now because it is dusty. She is taking Flonase and Claritin to manage her symptoms.  She reports having a fall at her new residence. She notes that she tripped on the steps due to being unfamiliar with the height. She has life alert and a fall detector and so her children were able to take care of her following the fall.  Her blood pressure is doing well during this visit. She continues taking 100 mg metoprolol succinate daily PO, 100 mg losartan daily PO, 120 mg diltiazem daily PO, and reports no new issues while taking it.   BP Readings from Last 3 Encounters:  03/25/21 128/90  01/02/21 (!) 160/100  09/29/19 (!) 160/90   Pulse Readings from Last 3 Encounters:  03/25/21 70  01/02/21 79  09/29/19 78   She is due for a colonoscopy and refused to set up another appointment.  She denies having any fever, new moles, congestion, sore throat, muscle pain, joint pain, chest pain, cough, SOB, wheezing, n/v/d, constipation, blood in stool, dysuria, frequency, hematuria, or headaches at this time. She is participating in regular exercise by walking and using a stationary bike.   She is receiving a flu vaccine during this morning. She has 1 johnson and [employer] vaccine at this time. She is planning to get the new bivalent Covid-19 vaccine after this visit. She is due for the her 2nd shingles vaccine and is planning on getting it at her pharmacy.  She is completing lab work today and reports eating a small granola bar with her breakfast this morning.  She reports her husband passed away last 2020/10/20 and she did not see a counselor following his death. She is not interested in receiving counseling at this time. She is part of a church group which is providing support to her.  She has an upcomming appointment with her vision care specialist.    Past Medical History:  Diagnosis Date   Depression    Hypertension    Menopausal syndrome     Past Surgical History:  Procedure Laterality Date   INGUINAL HERNIA REPAIR  2000   TUBAL LIGATION  1970    Family History  Problem Relation Age of Onset   Pancreatic cancer Mother    Cirrhosis Brother    Diabetes Other    Hypertension Other    Liver disease Other    Pancreatic cancer Other    Alcohol abuse Other    Depression Other    Asthma Son    Breast cancer Maternal Grandmother     Social History  Socioeconomic History   Marital status: Widowed    Spouse name: Not on file   Number of children: Not on file   Years of education: Not on file   Highest education level: Not on file  Occupational History   Not on file  Tobacco Use   Smoking status: Never   Smokeless tobacco: Never  Substance and Sexual Activity   Alcohol use: No   Drug use: No   Sexual activity: Not Currently    Partners: Male  Other Topics Concern   Not on file  Social History Narrative   Not on file   Social Determinants of Health   Financial Resource Strain: Low Risk    Difficulty of Paying Living Expenses: Not very hard  Food Insecurity: No Food Insecurity   Worried About Running Out of Food in the Last Year: Never true    Ran Out of Food in the Last Year: Never true  Transportation Needs: No Transportation Needs   Lack of Transportation (Medical): No   Lack of Transportation (Non-Medical): No  Physical Activity: Insufficiently Active   Days of Exercise per Week: 2 days   Minutes of Exercise per Session: 30 min  Stress: No Stress Concern Present   Feeling of Stress : Only a little  Social Connections: Moderately Isolated   Frequency of Communication with Friends and Family: More than three times a week   Frequency of Social Gatherings with Friends and Family: More than three times a week   Attends Religious Services: More than 4 times per year   Active Member of [employer] or Organizations: No   Attends Archivist Meetings: Never   Marital Status: Widowed  Human resources officer Violence: Not At Risk   Fear of Current or Ex-Partner: No   Emotionally Abused: No   Physically Abused: No   Sexually Abused: No    Outpatient Medications Prior to Visit  Medication Sig Dispense Refill   buPROPion (WELLBUTRIN XL) 300 MG 24 hr tablet Take 1 tablet by mouth once daily 90 tablet 1   Cholecalciferol (VITAMIN D) 2000 units CAPS 1 po qd 30 capsule    diltiazem (CARTIA XT) 120 MG 24 hr capsule Take 1 capsule (120 mg total) by mouth daily. 30 capsule 2   escitalopram (LEXAPRO) 20 MG tablet Take 1 tablet (20 mg total) by mouth daily. 90 tablet 3   fluticasone (FLONASE) 50 MCG/ACT nasal spray Place 2 sprays into both nostrils daily. 16 g 2   losartan (COZAAR) 100 MG tablet Take 1 tablet (100 mg total) by mouth daily. 90 tablet 1   metoprolol succinate (TOPROL-XL) 100 MG 24 hr tablet TAKE 1 TABLET BY MOUTH ONCE DAILY --TAKE  WITH  OR  IMMEDIATELY  FOLLOWING  A  MEAL 90 tablet 1   rosuvastatin (CRESTOR) 20 MG tablet TAKE 1 TABLET BY MOUTH ONCE DAILY --DOSE  INCREASE 90 tablet 0   spironolactone (ALDACTONE) 50 MG tablet Take 1 tablet (50 mg total) by mouth daily. 90 tablet 3   triamcinolone cream (KENALOG) 0.5 % Apply 1  application topically 3 (three) times daily. 30 g 1   Vitamin D, Ergocalciferol, (DRISDOL) 1.25 MG (50000 UNIT) CAPS capsule Take 1 capsule (50,000 Units total) by mouth every 7 (seven) days. 12 capsule 1   No facility-administered medications prior to visit.    Allergies  Allergen Reactions   Norvasc [Amlodipine Besylate] Other (See Comments)    Makes  Me feel crazy and dizziness   Lipitor [Atorvastatin Calcium] Other (  See Comments)    Night mares    Review of Systems  Constitutional:  Negative for fever.  HENT:  Negative for congestion and sore throat.   Respiratory:  Negative for cough, shortness of breath and wheezing.   Cardiovascular:  Negative for chest pain.  Gastrointestinal:  Negative for blood in stool, constipation, diarrhea, nausea and vomiting.  Genitourinary:  Negative for dysuria, frequency and hematuria.  Musculoskeletal:  Negative for joint pain and myalgias.       (+)knee pain  Skin:        (-)New moles  Neurological:  Negative for headaches.      Objective:    Physical Exam Vitals and nursing note reviewed.  Constitutional:      General: She is not in acute distress.    Appearance: Normal appearance. She is not ill-appearing.  HENT:     Head: Normocephalic and atraumatic.     Right Ear: Tympanic membrane, ear canal and external ear normal.     Left Ear: Tympanic membrane, ear canal and external ear normal.  Eyes:     Extraocular Movements: Extraocular movements intact.     Pupils: Pupils are equal, round, and reactive to light.  Cardiovascular:     Rate and Rhythm: Normal rate and regular rhythm.     Heart sounds: Normal heart sounds. No murmur heard.   No gallop.  Pulmonary:     Effort: Pulmonary effort is normal. No respiratory distress.     Breath sounds: Normal breath sounds. No wheezing or rales.  Abdominal:     General: Bowel sounds are normal. There is no distension.     Palpations: Abdomen is soft.     Tenderness: There is no abdominal  tenderness. There is no guarding.  Musculoskeletal:        General: Swelling present.     Right lower leg: Edema present.     Left lower leg: Edema present.     Comments: Swelling is baseline for pt   Skin:    General: Skin is warm and dry.  Neurological:     Mental Status: She is alert and oriented to person, place, and time.  Psychiatric:        Behavior: Behavior normal.    BP 128/90 (BP Location: Right Arm, Patient Position: Sitting, Cuff Size: Large)   Pulse 70   Temp 98 F (36.7 C) (Oral)   Resp 20   Ht 5\' 7"  (1.702 m)   Wt 259 lb 9.6 oz (117.8 kg)   SpO2 98%   BMI 40.66 kg/m  Wt Readings from Last 3 Encounters:  03/25/21 259 lb 9.6 oz (117.8 kg)  03/10/21 264 lb (119.7 kg)  01/02/21 264 lb 12.8 oz (120.1 kg)    Diabetic Foot Exam - Simple   No data filed    Lab Results  Component Value Date   WBC 7.9 09/29/2019   HGB 12.8 09/29/2019   HCT 40.2 09/29/2019   PLT 228.0 09/29/2019   GLUCOSE 96 01/02/2021   CHOL 170 01/02/2021   TRIG 65.0 01/02/2021   HDL 38.70 (L) 01/02/2021   LDLDIRECT 148.0 10/14/2010   LDLCALC 119 (H) 01/02/2021   ALT 10 01/02/2021   AST 12 01/02/2021   NA 140 01/02/2021   K 4.4 01/02/2021   CL 102 01/02/2021   CREATININE 0.89 01/02/2021   BUN 14 01/02/2021   CO2 30 01/02/2021   TSH 1.23 03/30/2019   HGBA1C 6.0 07/12/2014   MICROALBUR 0.8 11/15/2014  Lab Results  Component Value Date   TSH 1.23 03/30/2019   Lab Results  Component Value Date   WBC 7.9 09/29/2019   HGB 12.8 09/29/2019   HCT 40.2 09/29/2019   MCV 85.4 09/29/2019   PLT 228.0 09/29/2019   Lab Results  Component Value Date   NA 140 01/02/2021   K 4.4 01/02/2021   CO2 30 01/02/2021   GLUCOSE 96 01/02/2021   BUN 14 01/02/2021   CREATININE 0.89 01/02/2021   BILITOT 0.8 01/02/2021   ALKPHOS 84 01/02/2021   AST 12 01/02/2021   ALT 10 01/02/2021   PROT 7.2 01/02/2021   ALBUMIN 4.1 01/02/2021   CALCIUM 9.2 01/02/2021   GFR 64.35 01/02/2021   Lab  Results  Component Value Date   CHOL 170 01/02/2021   Lab Results  Component Value Date   HDL 38.70 (L) 01/02/2021   Lab Results  Component Value Date   LDLCALC 119 (H) 01/02/2021   Lab Results  Component Value Date   TRIG 65.0 01/02/2021   Lab Results  Component Value Date   CHOLHDL 4 01/02/2021   Lab Results  Component Value Date   HGBA1C 6.0 07/12/2014   Mammogram- Last completed 08/29/2017.  Dexa- Last completed 08/30/2015. Results are normal. Repeat in 2 years. Upcomming appointment scheduled.  Colonoscopy- Last completed 01/05/2012. Results showed 3 cm sessile polyp in the cecum, 2 polyps in the cecum, 3 mm sessile polyp in the descending colon, otherwise results are normal. Repeat in 1 year. Due---- pt does not want to repeat it --- she knows the importance and just does not want to do it      Assessment & Plan:   Problem List Items Addressed This Visit       Unprioritized   EDEMA    Stable Elevate legs        Hyperlipidemia    Encourage heart healthy diet such as MIND or DASH diet, increase exercise, avoid trans fats, simple carbohydrates and processed foods, consider a krill or fish or flaxseed oil cap daily.       Relevant Orders   Comprehensive metabolic panel   Lipid panel   Preventative health care - Primary    ghm utd Check labs       Primary hypertension    Well controlled, no changes to meds. Encouraged heart healthy diet such as the DASH diet and exercise as tolerated.       Relevant Orders   CBC with Differential/Platelet   Comprehensive metabolic panel   Lipid panel   Other Visit Diagnoses     Vitamin D deficiency       Relevant Medications   Vitamin D, Ergocalciferol, (DRISDOL) 1.25 MG (50000 UNIT) CAPS capsule   Other Relevant Orders   VITAMIN D 25 Hydroxy (Vit-D Deficiency, Fractures)   Need for influenza vaccination       Relevant Orders   Flu Vaccine QUAD High Dose(Fluad) (Completed)   Primary osteoarthritis of both knees        Relevant Medications   celecoxib (CELEBREX) 200 MG capsule        Meds ordered this encounter  Medications   Vitamin D, Ergocalciferol, (DRISDOL) 1.25 MG (50000 UNIT) CAPS capsule    Sig: Take 1 capsule (50,000 Units total) by mouth every 7 (seven) days.    Dispense:  12 capsule    Refill:  1    Patient is to also get otc vit d3 1000u to take daily   celecoxib (CELEBREX)  200 MG capsule    Sig: 1-2 po qd prn    Dispense:  60 capsule    Refill:  1    I, Dr. Roma Schanz, DO, personally preformed the services described in this documentation.  All medical record entries made by the scribe were at my direction and in my presence.  I have reviewed the chart and discharge instructions (if applicable) and agree that the record reflects my personal performance and is accurate and complete. 03/25/2021   I,[name],acting as a scribe for Ann Held, DO.,have documented all relevant documentation on the behalf of Ann Held, DO,as directed by  Ann Held, DO while in the presence of Ann Held, DO.   Ann Held, DO

## 2021-03-25 NOTE — Assessment & Plan Note (Signed)
ghm utd Check labs  

## 2021-03-25 NOTE — Assessment & Plan Note (Signed)
Encourage heart healthy diet such as MIND or DASH diet, increase exercise, avoid trans fats, simple carbohydrates and processed foods, consider a krill or fish or flaxseed oil cap daily.  °

## 2021-03-25 NOTE — Assessment & Plan Note (Signed)
Stable Elevate legs

## 2021-03-27 ENCOUNTER — Telehealth: Payer: Self-pay | Admitting: *Deleted

## 2021-03-27 NOTE — Telephone Encounter (Signed)
   Telephone encounter was:  Unsuccessful.  03/27/2021 Name: Michaela Martinez MRN: [id number] DOB: 1946-06-06  Unsuccessful outbound call made today to assist with:   housing  Outreach Attempt:  3rd Attempt.  Referral closed unable to contact patient.  A HIPAA compliant voice message was left requesting a return call.  Instructed patient to call back at   Instructed patient to call back at [phone]  at their earliest convenience. .  [place], Care Management  [phone] [address] , [place] [address] Email : [email or web address]

## 2021-04-14 ENCOUNTER — Telehealth: Payer: Self-pay | Admitting: *Deleted

## 2021-04-14 NOTE — Telephone Encounter (Signed)
Pt has lab appointment scheduled for Wednesday for comp and lipid but it looks like pt had those done in October. Pt states she thought she was supposed to be having bloodwork to follow up on some medication she was taking.  Just want to clarify if any labs are needed at this time before I cancel the appointment?

## 2021-04-15 NOTE — Telephone Encounter (Signed)
Pt made aware. Lab appt cancelled.

## 2021-04-16 ENCOUNTER — Other Ambulatory Visit: Payer: Medicare PPO

## 2021-04-22 ENCOUNTER — Other Ambulatory Visit (HOSPITAL_BASED_OUTPATIENT_CLINIC_OR_DEPARTMENT_OTHER): Payer: Self-pay

## 2021-04-22 MED ORDER — PFIZER COVID-19 VAC BIVALENT 30 MCG/0.3ML IM SUSP
INTRAMUSCULAR | 0 refills | Status: AC
  Filled 2021-04-22: qty 0.3, 1d supply, fill #0

## 2021-05-08 ENCOUNTER — Other Ambulatory Visit: Payer: Self-pay | Admitting: Family Medicine

## 2021-05-08 DIAGNOSIS — I10 Essential (primary) hypertension: Secondary | ICD-10-CM

## 2021-05-28 ENCOUNTER — Other Ambulatory Visit: Payer: Self-pay | Admitting: Family Medicine

## 2021-05-28 DIAGNOSIS — E785 Hyperlipidemia, unspecified: Secondary | ICD-10-CM

## 2021-06-19 ENCOUNTER — Other Ambulatory Visit: Payer: Self-pay | Admitting: Family Medicine

## 2021-06-19 DIAGNOSIS — F419 Anxiety disorder, unspecified: Secondary | ICD-10-CM

## 2021-06-30 ENCOUNTER — Other Ambulatory Visit: Payer: Self-pay | Admitting: Family Medicine

## 2021-06-30 DIAGNOSIS — F32A Depression, unspecified: Secondary | ICD-10-CM

## 2021-06-30 MED ORDER — ESCITALOPRAM OXALATE 20 MG PO TABS: 20.0000 mg | ORAL_TABLET | Freq: Every day | ORAL | 1 refills | Status: AC

## 2021-07-18 DIAGNOSIS — M1990 Unspecified osteoarthritis, unspecified site: Secondary | ICD-10-CM | POA: Diagnosis not present

## 2021-07-18 DIAGNOSIS — E785 Hyperlipidemia, unspecified: Secondary | ICD-10-CM | POA: Diagnosis not present

## 2021-07-18 DIAGNOSIS — I10 Essential (primary) hypertension: Secondary | ICD-10-CM | POA: Diagnosis not present

## 2021-07-18 DIAGNOSIS — Z79899 Other long term (current) drug therapy: Secondary | ICD-10-CM | POA: Diagnosis not present

## 2021-07-18 DIAGNOSIS — J309 Allergic rhinitis, unspecified: Secondary | ICD-10-CM | POA: Diagnosis not present

## 2021-07-18 DIAGNOSIS — E559 Vitamin D deficiency, unspecified: Secondary | ICD-10-CM | POA: Diagnosis not present

## 2021-07-18 DIAGNOSIS — K5900 Constipation, unspecified: Secondary | ICD-10-CM | POA: Diagnosis not present

## 2021-07-18 DIAGNOSIS — R2681 Unsteadiness on feet: Secondary | ICD-10-CM | POA: Diagnosis not present

## 2021-07-24 DIAGNOSIS — Z1231 Encounter for screening mammogram for malignant neoplasm of breast: Secondary | ICD-10-CM | POA: Diagnosis not present

## 2021-08-01 DIAGNOSIS — Z0000 Encounter for general adult medical examination without abnormal findings: Secondary | ICD-10-CM | POA: Diagnosis not present

## 2021-08-01 DIAGNOSIS — K5900 Constipation, unspecified: Secondary | ICD-10-CM | POA: Diagnosis not present

## 2021-08-01 DIAGNOSIS — R600 Localized edema: Secondary | ICD-10-CM | POA: Diagnosis not present

## 2021-08-01 DIAGNOSIS — Z0001 Encounter for general adult medical examination with abnormal findings: Secondary | ICD-10-CM | POA: Diagnosis not present

## 2021-08-01 DIAGNOSIS — J309 Allergic rhinitis, unspecified: Secondary | ICD-10-CM | POA: Diagnosis not present

## 2021-08-01 DIAGNOSIS — I739 Peripheral vascular disease, unspecified: Secondary | ICD-10-CM | POA: Diagnosis not present

## 2021-08-01 DIAGNOSIS — E785 Hyperlipidemia, unspecified: Secondary | ICD-10-CM | POA: Diagnosis not present

## 2021-08-01 DIAGNOSIS — I10 Essential (primary) hypertension: Secondary | ICD-10-CM | POA: Diagnosis not present

## 2021-08-29 ENCOUNTER — Other Ambulatory Visit: Payer: Self-pay | Admitting: Family Medicine

## 2021-08-29 DIAGNOSIS — F419 Anxiety disorder, unspecified: Secondary | ICD-10-CM

## 2021-09-22 ENCOUNTER — Other Ambulatory Visit (HOSPITAL_COMMUNITY): Payer: Self-pay | Admitting: Nurse Practitioner

## 2021-09-22 DIAGNOSIS — I10 Essential (primary) hypertension: Secondary | ICD-10-CM

## 2021-10-06 ENCOUNTER — Other Ambulatory Visit: Payer: Self-pay | Admitting: Family Medicine

## 2021-10-06 DIAGNOSIS — I10 Essential (primary) hypertension: Secondary | ICD-10-CM

## 2021-10-09 ENCOUNTER — Ambulatory Visit (HOSPITAL_COMMUNITY)
Admission: RE | Admit: 2021-10-09 | Discharge: 2021-10-09 | Disposition: A | Discharge status: Home / Self Care | Payer: Medicare HMO | Source: Ambulatory Visit | Attending: Nurse Practitioner | Admitting: Nurse Practitioner

## 2021-10-09 DIAGNOSIS — I119 Hypertensive heart disease without heart failure: Secondary | ICD-10-CM | POA: Insufficient documentation

## 2021-10-09 DIAGNOSIS — I10 Essential (primary) hypertension: Secondary | ICD-10-CM

## 2021-10-09 DIAGNOSIS — I358 Other nonrheumatic aortic valve disorders: Secondary | ICD-10-CM | POA: Diagnosis not present

## 2021-10-09 DIAGNOSIS — I3481 Nonrheumatic mitral (valve) annulus calcification: Secondary | ICD-10-CM | POA: Insufficient documentation

## 2021-10-09 DIAGNOSIS — G4730 Sleep apnea, unspecified: Secondary | ICD-10-CM | POA: Diagnosis not present

## 2021-10-09 LAB — ECHOCARDIOGRAM COMPLETE
AR max vel: 2.89 cm2
AV Peak grad: 3.8 mmHg
Ao pk vel: 0.97 m/s
Area-P 1/2: 5.93 cm2
S' Lateral: 2.8 cm

## 2021-12-19 ENCOUNTER — Other Ambulatory Visit: Payer: Self-pay | Admitting: Family Medicine

## 2021-12-19 DIAGNOSIS — F419 Anxiety disorder, unspecified: Secondary | ICD-10-CM

## 2021-12-19 DIAGNOSIS — I10 Essential (primary) hypertension: Secondary | ICD-10-CM

## 2021-12-19 DIAGNOSIS — F32A Depression, unspecified: Secondary | ICD-10-CM

## 2021-12-26 ENCOUNTER — Ambulatory Visit
Admission: RE | Admit: 2021-12-26 | Discharge: 2021-12-26 | Disposition: A | Discharge status: Home / Self Care | Payer: Medicare HMO | Source: Ambulatory Visit | Attending: Family Medicine | Admitting: Family Medicine

## 2021-12-26 DIAGNOSIS — Z780 Asymptomatic menopausal state: Secondary | ICD-10-CM

## 2022-01-08 ENCOUNTER — Other Ambulatory Visit: Payer: Medicare PPO

## 2022-03-12 ENCOUNTER — Ambulatory Visit: Payer: Medicare HMO

## 2022-05-29 ENCOUNTER — Other Ambulatory Visit: Payer: Self-pay | Admitting: Family Medicine

## 2022-05-29 DIAGNOSIS — I10 Essential (primary) hypertension: Secondary | ICD-10-CM
